# Patient Record
Sex: Male | Born: 1980 | Race: White | Hispanic: No | Marital: Single | Smoking: Never smoker
Health system: Southern US, Community
[De-identification: ages and names within clinical notes are randomized; demographics above are authoritative.]

## PROBLEM LIST (undated history)

## (undated) DIAGNOSIS — J45909 Unspecified asthma, uncomplicated: Secondary | ICD-10-CM

## (undated) DIAGNOSIS — M1990 Unspecified osteoarthritis, unspecified site: Secondary | ICD-10-CM

## (undated) DIAGNOSIS — Z1507 Genetic susceptibility to other malignant neoplasm: Secondary | ICD-10-CM

## (undated) DIAGNOSIS — I10 Essential (primary) hypertension: Secondary | ICD-10-CM

## (undated) DIAGNOSIS — Z1509 Genetic susceptibility to other malignant neoplasm: Secondary | ICD-10-CM

## (undated) DIAGNOSIS — Z800 Family history of malignant neoplasm of digestive organs: Secondary | ICD-10-CM

## (undated) DIAGNOSIS — T7840XA Allergy, unspecified, initial encounter: Secondary | ICD-10-CM

## (undated) DIAGNOSIS — Z801 Family history of malignant neoplasm of trachea, bronchus and lung: Secondary | ICD-10-CM

## (undated) DIAGNOSIS — F419 Anxiety disorder, unspecified: Secondary | ICD-10-CM

## (undated) DIAGNOSIS — K219 Gastro-esophageal reflux disease without esophagitis: Secondary | ICD-10-CM

## (undated) HISTORY — PX: HERNIA REPAIR: SHX51

## (undated) HISTORY — DX: Unspecified osteoarthritis, unspecified site: M19.90

## (undated) HISTORY — DX: Gastro-esophageal reflux disease without esophagitis: K21.9

## (undated) HISTORY — DX: Allergy, unspecified, initial encounter: T78.40XA

## (undated) HISTORY — DX: Anxiety disorder, unspecified: F41.9

## (undated) HISTORY — DX: Genetic susceptibility to other malignant neoplasm: Z15.09

## (undated) HISTORY — PX: POLYPECTOMY: SHX149

## (undated) HISTORY — DX: Family history of malignant neoplasm of trachea, bronchus and lung: Z80.1

## (undated) HISTORY — DX: Genetic susceptibility to other malignant neoplasm: Z15.07

## (undated) HISTORY — DX: Essential (primary) hypertension: I10

## (undated) HISTORY — DX: Unspecified asthma, uncomplicated: J45.909

## (undated) HISTORY — PX: COLONOSCOPY: SHX174

## (undated) HISTORY — DX: Family history of malignant neoplasm of digestive organs: Z80.0

---
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
## 2005-10-31 ENCOUNTER — Ambulatory Visit: Payer: Self-pay | Admitting: Family Medicine

## 2011-10-20 ENCOUNTER — Encounter (HOSPITAL_BASED_OUTPATIENT_CLINIC_OR_DEPARTMENT_OTHER): Payer: Self-pay | Admitting: Emergency Medicine

## 2011-10-20 ENCOUNTER — Emergency Department (HOSPITAL_BASED_OUTPATIENT_CLINIC_OR_DEPARTMENT_OTHER)
Admission: EM | Admit: 2011-10-20 | Discharge: 2011-10-20 | Disposition: A | Discharge status: Home / Self Care | Payer: Self-pay | Attending: Emergency Medicine | Admitting: Emergency Medicine

## 2011-10-20 DIAGNOSIS — L509 Urticaria, unspecified: Secondary | ICD-10-CM | POA: Insufficient documentation

## 2011-10-20 DIAGNOSIS — L259 Unspecified contact dermatitis, unspecified cause: Secondary | ICD-10-CM | POA: Insufficient documentation

## 2011-10-20 DIAGNOSIS — L309 Dermatitis, unspecified: Secondary | ICD-10-CM

## 2011-10-20 MED ORDER — PREDNISONE 10 MG PO TABS: ORAL_TABLET | ORAL | Status: DC

## 2011-10-20 MED ORDER — TRIAMCINOLONE ACETONIDE 0.5 % EX OINT: TOPICAL_OINTMENT | Freq: Two times a day (BID) | CUTANEOUS | Status: AC

## 2011-10-20 MED ORDER — EPINEPHRINE 0.3 MG/0.3ML IJ DEVI: 0.3000 mg | INTRAMUSCULAR | Status: DC | PRN

## 2011-10-20 MED ORDER — FAMOTIDINE 20 MG PO TABS
20.0000 mg | ORAL_TABLET | Freq: Once | ORAL | Status: AC
  Administered 2011-10-20: 20 mg via ORAL
  Filled 2011-10-20: qty 1

## 2011-10-20 MED ORDER — DIPHENHYDRAMINE HCL 50 MG PO CAPS
50.0000 mg | ORAL_CAPSULE | Freq: Once | ORAL | Status: AC
  Administered 2011-10-20: 50 mg via ORAL
  Filled 2011-10-20: qty 1

## 2011-10-20 MED ORDER — DIPHENHYDRAMINE HCL 25 MG PO CAPS
ORAL_CAPSULE | ORAL | Status: AC
  Filled 2011-10-20: qty 2

## 2011-10-20 MED ORDER — HYDROXYZINE HCL 25 MG PO TABS: 25.0000 mg | ORAL_TABLET | Freq: Four times a day (QID) | ORAL | Status: AC

## 2011-10-20 MED ORDER — PREDNISONE 50 MG PO TABS
60.0000 mg | ORAL_TABLET | Freq: Once | ORAL | Status: AC
  Administered 2011-10-20: 60 mg via ORAL
  Filled 2011-10-20: qty 1

## 2011-10-20 NOTE — ED Notes (Signed)
Pt has red raised rash on arms onset yesterday, worsening tonight.

## 2011-10-20 NOTE — ED Provider Notes (Signed)
History     CSN: [id number]  Arrival date & time 10/20/11  0236   First MD Initiated Contact with Patient 10/20/11 0242      Chief Complaint  Patient presents with  . Rash    (Consider location/radiation/quality/duration/timing/severity/associated sxs/prior treatment) HPI Patient is a 31 year old male who presents tonight complaining of rash over his bilateral forearms and now his legs. Patient noticed this for the first time yesterday. He says it is now extended up to his forearms. The reaction began in his wrists. 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 He wears gloves and long sleeves. The globes are provided by the company and he gets a new pair every week. They have been the same every time.  He reports that there cough and not latex. He cannot think of any substances that he has had to handle was contaminated but his gloves in his sleeves. One of his coworkers reports that he had a similar reaction when he first started working there. Patient also has lesions on the anterior surface of his shins as well as on the palms of his hands. There are also several urticarial-appearing lesions on his right shoulder and on his abdomen. He's had no abdominal pain, nausea, vomiting, and difficulty with swallowing, or difficulty with breathing. He has not taken anything for this. Patient does describe pain in his hands from the swelling and he does appear to have swelling bilaterally. He has no new drug or topical exposures. He has no known history of hives or allergic reactions. There are no other associated or modifying factors. History reviewed. No pertinent past medical history.  Past Surgical History  Procedure Date  . Hernia repair     No family history on file.  History  Substance Use Topics  . Smoking status: Never Smoker   . Smokeless tobacco: Not on file  . Alcohol Use: Yes      Review of Systems  Constitutional: Negative.   HENT:  Negative.   Eyes: Negative.   Respiratory: Negative.   Cardiovascular: Negative.   Gastrointestinal: Negative.   Genitourinary: Negative.   Musculoskeletal: Negative.   Skin: Positive for rash.  Neurological: Negative.   Hematological: Negative.   Psychiatric/Behavioral: Negative.   All other systems reviewed and are negative.    Allergies  Amoxil  Home Medications  No current outpatient prescriptions on file.  BP 127/82  Pulse 86  Temp(Src) 97.8 F (36.6 C) (Oral)  Resp 18  SpO2 99%  Physical Exam  Nursing note and vitals reviewed. Constitutional: He is oriented to person, place, and time. He appears well-developed and well-nourished. No distress.  HENT:  Head: Normocephalic and atraumatic.  Eyes: Conjunctivae and EOM are normal. Pupils are equal, round, and reactive to light.  Neck: Normal range of motion.  Cardiovascular: Normal rate, regular rhythm, normal heart sounds and intact distal pulses.  Exam reveals no gallop and no friction rub.   No murmur heard. Pulmonary/Chest: Effort normal and breath sounds normal. No respiratory distress. He has no wheezes.  Abdominal: Soft. Bowel sounds are normal. He exhibits no distension. There is no tenderness. There is no rebound and no guarding.  Musculoskeletal: Normal range of motion.  Neurological: He is alert and oriented to person, place, and time.  Skin: Skin is warm and dry. Rash noted.       Patient has urticarial rash that accompanies his entire hand and extends 3 inches proximally over the volar forearm.  There are numerous satellite lesions that also appear urticarial. They are raised erythematous plaques that are blanching. Patient also has urticaria noted on the anterior surface of the shins bilaterally. There are also small lesions noted over the right shoulder and a few noted on the abdomen as well  Psychiatric: He has a normal mood and affect.    ED Course  Procedures (including critical care time)  Labs  Reviewed - No data to display No results found.   1. Urticaria   2. Dermatitis due to unknown cause       MDM  Patient's presentation was consistent with urticaria and was secondary to an unknown substance based on history. Patient was treated with Benadryl, Pepcid, and prednisone. Patient had stable vital signs and no evidence of airway compromise or intestinal edema. Patient was given ice for swelling in his hands. He was specifically asked about numerous household and industrial exposures. No precipitating factor was identified. Patient is not on any medications and has consumed no new foods. His exposures likely occupational as he has recently begun working in a scrap yard over the past 3 weeks.  Patient did have some improvement. He remained hemodynamically stable. Patient was discharged with several prescriptions. I do think his symptoms are a combination of urticaria as well as possible contact dermatitis as well. He is given a prescription for triamcinolone cream to apply twice daily. He was also given a prescription for prednisone taper. EpiPen prescription was also given. Finally patient was told that he can take Benadryl over-the-counter every 6 hours as needed for his symptoms however if he continues to have itching with this he can instead use prescription for hydroxyzine there was also given today. It is made clear that he is not to take both of these. Patient was warned about the side effects of these medications including somnolence. A work note was provided.        Cyndra Numbers, MD 10/20/11 [phone]

## 2011-10-20 NOTE — ED Notes (Signed)
Pt awake and alert. Pt ready to go home.

## 2011-10-20 NOTE — ED Notes (Signed)
Redness and rash improving post medication. Pt too drowsy to drive at this time. Pt allowed to sleep until ready to leave.

## 2015-05-21 ENCOUNTER — Ambulatory Visit (INDEPENDENT_AMBULATORY_CARE_PROVIDER_SITE_OTHER): Payer: BLUE CROSS/BLUE SHIELD | Admitting: Osteopathic Medicine

## 2015-05-21 ENCOUNTER — Encounter: Payer: Self-pay | Admitting: Osteopathic Medicine

## 2015-05-21 VITALS — BP 133/84 | HR 96 | Ht 67.0 in | Wt 228.0 lb

## 2015-05-21 DIAGNOSIS — Z13220 Encounter for screening for lipoid disorders: Secondary | ICD-10-CM | POA: Diagnosis not present

## 2015-05-21 DIAGNOSIS — Z114 Encounter for screening for human immunodeficiency virus [HIV]: Secondary | ICD-10-CM | POA: Insufficient documentation

## 2015-05-21 DIAGNOSIS — Z0000 Encounter for general adult medical examination without abnormal findings: Secondary | ICD-10-CM | POA: Insufficient documentation

## 2015-05-21 DIAGNOSIS — Z1159 Encounter for screening for other viral diseases: Secondary | ICD-10-CM | POA: Insufficient documentation

## 2015-05-21 DIAGNOSIS — R5382 Chronic fatigue, unspecified: Secondary | ICD-10-CM | POA: Diagnosis not present

## 2015-05-21 DIAGNOSIS — F810 Specific reading disorder: Secondary | ICD-10-CM | POA: Insufficient documentation

## 2015-05-21 NOTE — Progress Notes (Signed)
Chief complaint: "Get a doctor"  HPI: Nicholas Steele is a 34 y.o. male who presents to [place]  today to establish primary care physician. Has not been to a doctor in about 15 years. Reports problems with attention deficit disorder. States his boss wants him to be on medication for attention deficit (which he refers to as "hypertension disorder"), was on Ritalin in the past when he was a kid, doesn't remember if it helped. Had to go to specialist in [place], [place] - doesn't remember specific diagnosis. He also states his bath has concerns about depression. Patient denies thoughts of suicide. 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 Past Surgical History  Procedure Laterality Date  . Hernia repair     Social History  Substance Use Topics  . Smoking status: Never Smoker   . Smokeless tobacco: Not on file  . Alcohol Use: Yes   Medications: No current outpatient prescriptions on file.   No current facility-administered medications for this visit.   Allergies  Allergen Reactions  . Amoxicillin      Review of Systems: CONSTITUTIONAL: Neg fever/chills, no  unintentional weight changes, positive fatigue HEAD/EYES/EARS/NOSE: No headache/vision change/hearing change CARDIAC: No chest pain/pressure/palpitations, no orthopnea RESPIRATORY: No cough/shortness of breath/wheeze GASTROINTESTINAL: No nausea/vomiting/abdominal pain/blood in stool/diarrhea/constipation MUSCULOSKELETAL: reports occasional myalgia/arthralgia GENITOURINARY: No incontinence, No abnormal genital bleeding/discharge. SKIN: No rash/wounds/concerning lesions  HEM/ONC: No easy bruising/bleeding, no abnormal lymph node  PSYCHIATRIC: Concerned about anxiety, sleeping problem, depression. Never been on medications.  Endocrine: No heat or cold intolerance, positive fatigue, no hair or skin changes    Exam:  BP 133/84 mmHg  Pulse 96  Ht 5\' 7"  (1.702 m)  Wt 228 lb (103.42 kg)  BMI 35.70 kg/m2  SpO2 96% Constitutional: VSS, see nurse notes. General Appearance: alert, well-developed, well-nourished, NAD Eyes: Normal lids and conjunctive, non-icteric sclera, PERRLA Ears, Nose, Mouth, Throat: Normal external auditory canal and TM bilaterally, MMM, posterior pharynx without erythema/exudate Neck: No masses, trachea midline. No thyroid enlargement/tenderness/mass appreciated Respiratory: Normal respiratory effort. No dullness/hyper-resonance to percussion. Breath sounds normal, no wheeze/rhonchi/rales Cardiovascular: S1/S2 normal, no murmur/rub/gallop auscultated. No carotid bruit or JVD. Pedal pulse II/IV bilaterally DP and PT. No lower extremity edema Gastrointestinal: Nontender, no masses. No hepatomegaly, no splenomegaly. No hernia appreciated.  Musculoskeletal: Gait normal. No clubbing/cyanosis of digits. Neurological: No cranial nerve deficit on limited exam. Motor and sensation intact and symmetric Psychiatric: Limited judgment/insight. Normal mood and affect   No results found for this or any previous visit (from the past 24 hour(s)). No results  found.   ASSESSMENT/PLAN: Please see individual assessment and plan sections for details and orders.  Summary and/or additional information as follows: Discussed with the patient the importance of routine  preventive care. Will obtain basic screening tests and lab work today and encouraged follow-up to address the multiple other problems which he has not had addressed since he last saw a doctor 15 years ago. Suspect learning disability more likely than attention deficit problem, likely component of depression as well. We'll explore this further on subsequent visits. Patient has great difficulty reading and spelling.   MALE PREVENTIVE CARE  ANNUAL SCREENING/COUNSELING Tobacco - NEVER Alcohol - DAILY  Diet/Exercise - HEALTHY Sexual Health/STI - NO CONCERNS FOR STI TESTING AT THIS TIME  Depression - PQH2 (+) Domestic violence - NO CONCERNS HTN - SEE VITALS Vaccination status - UTD PER PATIENT  INFECTIOUS DISEASE SCREENING HIV - all adults 15-65 - DUE HepC - born 72-1966 - DUE  DISEASE SCREENING Lipid - age 33+ if risk, all age 54+ - DUE  DM2 - overweight age 20-70 or other risk factors - DUE AAA - [place] once, age 57-75 if risk - NO NEED  CANCER SCREENING Lung - low dose CT Chest age 54-80 - NEVER SMOKER Colon - age 8+ or 35 years of age prior to [place] Dx - NORMAL COLONOSCOPY AT AGE 75, DUE FOR REPEAT (Q5YR WAS RECOMMENDED) Prostate - discuss PSA options - DECLINES  VACCINATION Influenza - annual - RECOMMENDED TD - every 10 years - PT UNSURE LAST VACCINE BUT WAS FEW YEARS AGO (WORKS WITH SCRAP MET[employer]) HPV - age <55yo - NO NEED NOW Zoster - age 88+ - NO NEED NOW Pneumonia - age 60+ sooner if risk (DM, other) - NO NEED NOW  OTHER Fall - exercise and Vit D age 60+ - NO NEED NOW Consider ASA - age 41-59 - NO NEED NOW

## 2015-05-21 NOTE — Addendum Note (Signed)
Addended by: Maryla Morrow on: 05/21/2015 03:28 PM   Modules accepted: Level of Service

## 2015-05-21 NOTE — Patient Instructions (Signed)
Preventive Care for Adults A healthy lifestyle and preventive care can promote health and wellness. Preventive health guidelines for men include the following key practices:  A routine yearly physical is a good way to check with your health care provider about your health and preventative screening. It is a chance to share any concerns and updates on your health and to receive a thorough exam.  Visit your dentist for a routine exam and preventative care every 6 months. Brush your teeth twice a day and floss once a day. Good oral hygiene prevents tooth decay and gum disease.  The frequency of eye exams is based on your age, health, family medical history, use of contact lenses, and other factors. Follow your health care provider's recommendations for frequency of eye exams.  Eat a healthy diet. Foods such as vegetables, fruits, whole grains, low-fat dairy products, and lean protein foods contain the nutrients you need without too many calories. Decrease your intake of foods high in solid fats, added sugars, and salt. Eat the right amount of calories for you.Get information about a proper diet from your health care provider, if necessary.  Regular physical exercise is one of the most important things you can do for your health. Most adults should get at least 150 minutes of moderate-intensity exercise (any activity that increases your heart rate and causes you to sweat) each week. In addition, most adults need muscle-strengthening exercises on 2 or more days a week.  Maintain a healthy weight. The body mass index (BMI) is a screening tool to identify possible weight problems. It provides an estimate of body fat based on height and weight. Your health care provider can find your BMI and can help you achieve or maintain a healthy weight.For adults 20 years and older:  A BMI below 18.5 is considered underweight.  A BMI of 18.5 to 24.9 is normal.  A BMI of 25 to 29.9 is considered overweight.  A BMI  of 30 and above is considered obese.  Maintain normal blood lipids and cholesterol levels by exercising and minimizing your intake of saturated fat. Eat a balanced diet with plenty of fruit and vegetables. Blood tests for lipids and cholesterol should begin at age 20 and be repeated every 5 years. If your lipid or cholesterol levels are high, you are over 50, or you are at high risk for heart disease, you may need your cholesterol levels checked more frequently.Ongoing high lipid and cholesterol levels should be treated with medicines if diet and exercise are not working.  If you smoke, find out from your health care provider how to quit. If you do not use tobacco, do not start.  Lung cancer screening is recommended for adults aged 55-80 years who are at high risk for developing lung cancer because of a history of smoking. A yearly low-dose CT scan of the lungs is recommended for people who have at least a 30-pack-year history of smoking and are a current smoker or have quit within the past 15 years. A pack year of smoking is smoking an average of 1 pack of cigarettes a day for 1 year (for example: 1 pack a day for 30 years or 2 packs a day for 15 years). Yearly screening should continue until the smoker has stopped smoking for at least 15 years. Yearly screening should be stopped for people who develop a health problem that would prevent them from having lung cancer treatment.  If you choose to drink alcohol, do not have more than   2 drinks per day. One drink is considered to be 12 ounces (355 mL) of beer, 5 ounces (148 mL) of wine, or 1.5 ounces (44 mL) of liquor.  Avoid use of street drugs. Do not share needles with anyone. Ask for help if you need support or instructions about stopping the use of drugs.  High blood pressure causes heart disease and increases the risk of stroke. Your blood pressure should be checked at least every 1-2 years. Ongoing high blood pressure should be treated with  medicines, if weight loss and exercise are not effective.  If you are 45-79 years old, ask your health care provider if you should take aspirin to prevent heart disease.  Diabetes screening involves taking a blood sample to check your fasting blood sugar level. This should be done once every 3 years, after age 45, if you are within normal weight and without risk factors for diabetes. Testing should be considered at a younger age or be carried out more frequently if you are overweight and have at least 1 risk factor for diabetes.  Colorectal cancer can be detected and often prevented. Most routine colorectal cancer screening begins at the age of 50 and continues through age 75. However, your health care provider may recommend screening at an earlier age if you have risk factors for colon cancer. On a yearly basis, your health care provider may provide home test kits to check for hidden blood in the stool. Use of a small camera at the end of a tube to directly examine the colon (sigmoidoscopy or colonoscopy) can detect the earliest forms of colorectal cancer. Talk to your health care provider about this at age 50, when routine screening begins. Direct exam of the colon should be repeated every 5-10 years through age 75, unless early forms of precancerous polyps or small growths are found.  People who are at an increased risk for hepatitis B should be screened for this virus. You are considered at high risk for hepatitis B if:  You were born in a country where hepatitis B occurs often. Talk with your health care provider about which countries are considered high risk.  Your parents were born in a high-risk country and you have not received a shot to protect against hepatitis B (hepatitis B vaccine).  You have HIV or AIDS.  You use needles to inject street drugs.  You live with, or have sex with, someone who has hepatitis B.  You are a man who has sex with other men (MSM).  You get hemodialysis  treatment.  You take certain medicines for conditions such as cancer, organ transplantation, and autoimmune conditions.  Hepatitis C blood testing is recommended for all people born from 1945 through 1965 and any individual with known risks for hepatitis C.  Practice safe sex. Use condoms and avoid high-risk sexual practices to reduce the spread of sexually transmitted infections (STIs). STIs include gonorrhea, chlamydia, syphilis, trichomonas, herpes, HPV, and human immunodeficiency virus (HIV). Herpes, HIV, and HPV are viral illnesses that have no cure. They can result in disability, cancer, and death.  If you are at risk of being infected with HIV, it is recommended that you take a prescription medicine daily to prevent HIV infection. This is called preexposure prophylaxis (PrEP). You are considered at risk if:  You are a man who has sex with other men (MSM) and have other risk factors.  You are a heterosexual man, are sexually active, and are at increased risk for HIV infection.    You take drugs by injection.  You are sexually active with a partner who has HIV.  Talk with your health care provider about whether you are at high risk of being infected with HIV. If you choose to begin PrEP, you should first be tested for HIV. You should then be tested every 3 months for as long as you are taking PrEP.  A one-time screening for abdominal aortic aneurysm (AAA) and surgical repair of large AAAs by ultrasound are recommended for men ages 65 to 75 years who are current or former smokers.  Healthy men should no longer receive prostate-specific antigen (PSA) blood tests as part of routine cancer screening. Talk with your health care provider about prostate cancer screening.  Testicular cancer screening is not recommended for adult males who have no symptoms. Screening includes self-exam, a health care provider exam, and other screening tests. Consult with your health care provider about any symptoms  you have or any concerns you have about testicular cancer.  Use sunscreen. Apply sunscreen liberally and repeatedly throughout the day. You should seek shade when your shadow is shorter than you. Protect yourself by wearing long sleeves, pants, a wide-brimmed hat, and sunglasses year round, whenever you are outdoors.  Once a month, do a whole-body skin exam, using a mirror to look at the skin on your back. Tell your health care provider about new moles, moles that have irregular borders, moles that are larger than a pencil eraser, or moles that have changed in shape or color.  Stay current with required vaccines (immunizations).  Influenza vaccine. All adults should be immunized every year.  Tetanus, diphtheria, and acellular pertussis (Td, Tdap) vaccine. An adult who has not previously received Tdap or who does not know his vaccine status should receive 1 dose of Tdap. This initial dose should be followed by tetanus and diphtheria toxoids (Td) booster doses every 10 years. Adults with an unknown or incomplete history of completing a 3-dose immunization series with Td-containing vaccines should begin or complete a primary immunization series including a Tdap dose. Adults should receive a Td booster every 10 years.  Varicella vaccine. An adult without evidence of immunity to varicella should receive 2 doses or a second dose if he has previously received 1 dose.  Human papillomavirus (HPV) vaccine. Males aged 13-21 years who have not received the vaccine previously should receive the 3-dose series. Males aged 22-26 years may be immunized. Immunization is recommended through the age of 26 years for any male who has sex with males and did not get any or all doses earlier. Immunization is recommended for any person with an immunocompromised condition through the age of 26 years if he did not get any or all doses earlier. During the 3-dose series, the second dose should be obtained 4-8 weeks after the first  dose. The third dose should be obtained 24 weeks after the first dose and 16 weeks after the second dose.  Zoster vaccine. One dose is recommended for adults aged 60 years or older unless certain conditions are present.  Measles, mumps, and rubella (MMR) vaccine. Adults born before 1957 generally are considered immune to measles and mumps. Adults born in 1957 or later should have 1 or more doses of MMR vaccine unless there is a contraindication to the vaccine or there is laboratory evidence of immunity to each of the three diseases. A routine second dose of MMR vaccine should be obtained at least 28 days after the first dose for students attending postsecondary   schools, health care workers, or international travelers. People who received inactivated measles vaccine or an unknown type of measles vaccine during 1963-1967 should receive 2 doses of MMR vaccine. People who received inactivated mumps vaccine or an unknown type of mumps vaccine before 1979 and are at high risk for mumps infection should consider immunization with 2 doses of MMR vaccine. Unvaccinated health care workers born before 1957 who lack laboratory evidence of measles, mumps, or rubella immunity or laboratory confirmation of disease should consider measles and mumps immunization with 2 doses of MMR vaccine or rubella immunization with 1 dose of MMR vaccine.  Pneumococcal 13-valent conjugate (PCV13) vaccine. When indicated, a person who is uncertain of his immunization history and has no record of immunization should receive the PCV13 vaccine. An adult aged 19 years or older who has certain medical conditions and has not been previously immunized should receive 1 dose of PCV13 vaccine. This PCV13 should be followed with a dose of pneumococcal polysaccharide (PPSV23) vaccine. The PPSV23 vaccine dose should be obtained at least 8 weeks after the dose of PCV13 vaccine. An adult aged 19 years or older who has certain medical conditions and  previously received 1 or more doses of PPSV23 vaccine should receive 1 dose of PCV13. The PCV13 vaccine dose should be obtained 1 or more years after the last PPSV23 vaccine dose.  Pneumococcal polysaccharide (PPSV23) vaccine. When PCV13 is also indicated, PCV13 should be obtained first. All adults aged 65 years and older should be immunized. An adult younger than age 65 years who has certain medical conditions should be immunized. Any person who resides in a nursing home or long-term care facility should be immunized. An adult smoker should be immunized. People with an immunocompromised condition and certain other conditions should receive both PCV13 and PPSV23 vaccines. People with human immunodeficiency virus (HIV) infection should be immunized as soon as possible after diagnosis. Immunization during chemotherapy or radiation therapy should be avoided. Routine use of PPSV23 vaccine is not recommended for American Indians, [place] Natives, or people younger than 65 years unless there are medical conditions that require PPSV23 vaccine. When indicated, people who have unknown immunization and have no record of immunization should receive PPSV23 vaccine. One-time revaccination 5 years after the first dose of PPSV23 is recommended for people aged 19-64 years who have chronic kidney failure, nephrotic syndrome, asplenia, or immunocompromised conditions. People who received 1-2 doses of PPSV23 before age 65 years should receive another dose of PPSV23 vaccine at age 65 years or later if at least 5 years have passed since the previous dose. Doses of PPSV23 are not needed for people immunized with PPSV23 at or after age 65 years.  Meningococcal vaccine. Adults with asplenia or persistent complement component deficiencies should receive 2 doses of quadrivalent meningococcal conjugate (MenACWY-D) vaccine. The doses should be obtained at least 2 months apart. Microbiologists working with certain meningococcal bacteria,  military recruits, people at risk during an outbreak, and people who travel to or live in countries with a high rate of meningitis should be immunized. A first-year college student up through age 21 years who is living in a residence hall should receive a dose if he did not receive a dose on or after his 16th birthday. Adults who have certain high-risk conditions should receive one or more doses of vaccine.  Hepatitis A vaccine. Adults who wish to be protected from this disease, have certain high-risk conditions, work with hepatitis A-infected animals, work in hepatitis A research labs, or   travel to or work in countries with a high rate of hepatitis A should be immunized. Adults who were previously unvaccinated and who anticipate close contact with an international adoptee during the first 60 days after arrival in the [place] from a country with a high rate of hepatitis A should be immunized.  Hepatitis B vaccine. Adults should be immunized if they wish to be protected from this disease, have certain high-risk conditions, may be exposed to blood or other infectious body fluids, are household contacts or sex partners of hepatitis B positive people, are clients or workers in certain care facilities, or travel to or work in countries with a high rate of hepatitis B.  Haemophilus influenzae type b (Hib) vaccine. A previously unvaccinated person with asplenia or sickle cell disease or having a scheduled splenectomy should receive 1 dose of Hib vaccine. Regardless of previous immunization, a recipient of a hematopoietic stem cell transplant should receive a 3-dose series 6-12 months after his successful transplant. Hib vaccine is not recommended for adults with HIV infection. Preventive Service / Frequency Ages 19 to 39  Blood pressure check.** / Every 1 to 2 years.  Lipid and cholesterol check.** / Every 5 years beginning at age 20.  Hepatitis C blood test.** / For any individual with known risks for  hepatitis C.  Skin self-exam. / Monthly.  Influenza vaccine. / Every year.  Tetanus, diphtheria, and acellular pertussis (Tdap, Td) vaccine.** / Consult your health care provider. 1 dose of Td every 10 years.  Varicella vaccine.** / Consult your health care provider.  HPV vaccine. / 3 doses over 6 months, if 26 or younger.  Measles, mumps, rubella (MMR) vaccine.** / You need at least 1 dose of MMR if you were born in 1957 or later. You may also need a second dose.  Pneumococcal 13-valent conjugate (PCV13) vaccine.** / Consult your health care provider.  Pneumococcal polysaccharide (PPSV23) vaccine.** / 1 to 2 doses if you smoke cigarettes or if you have certain conditions.  Meningococcal vaccine.** / 1 dose if you are age 19 to 21 years and a first-year college student living in a residence hall, or have one of several medical conditions. You may also need additional booster doses.  Hepatitis A vaccine.** / Consult your health care provider.  Hepatitis B vaccine.** / Consult your health care provider.  Haemophilus influenzae type b (Hib) vaccine.** / Consult your health care provider. Ages 40 to 64  Blood pressure check.** / Every 1 to 2 years.  Lipid and cholesterol check.** / Every 5 years beginning at age 20.  Lung cancer screening. / Every year if you are aged 55-80 years and have a 30-pack-year history of smoking and currently smoke or have quit within the past 15 years. Yearly screening is stopped once you have quit smoking for at least 15 years or develop a health problem that would prevent you from having lung cancer treatment.  Fecal occult blood test (FOBT) of stool. / Every year beginning at age 50 and continuing until age 75. You may not have to do this test if you get a colonoscopy every 10 years.  Flexible sigmoidoscopy** or colonoscopy.** / Every 5 years for a flexible sigmoidoscopy or every 10 years for a colonoscopy beginning at age 50 and continuing until age  75.  Hepatitis C blood test.** / For all people born from 1945 through 1965 and any individual with known risks for hepatitis C.  Skin self-exam. / Monthly.  Influenza vaccine. / Every   year.  Tetanus, diphtheria, and acellular pertussis (Tdap/Td) vaccine.** / Consult your health care provider. 1 dose of Td every 10 years.  Varicella vaccine.** / Consult your health care provider.  Zoster vaccine.** / 1 dose for adults aged 60 years or older.  Measles, mumps, rubella (MMR) vaccine.** / You need at least 1 dose of MMR if you were born in 1957 or later. You may also need a second dose.  Pneumococcal 13-valent conjugate (PCV13) vaccine.** / Consult your health care provider.  Pneumococcal polysaccharide (PPSV23) vaccine.** / 1 to 2 doses if you smoke cigarettes or if you have certain conditions.  Meningococcal vaccine.** / Consult your health care provider.  Hepatitis A vaccine.** / Consult your health care provider.  Hepatitis B vaccine.** / Consult your health care provider.  Haemophilus influenzae type b (Hib) vaccine.** / Consult your health care provider. Ages 65 and over  Blood pressure check.** / Every 1 to 2 years.  Lipid and cholesterol check.**/ Every 5 years beginning at age 20.  Lung cancer screening. / Every year if you are aged 55-80 years and have a 30-pack-year history of smoking and currently smoke or have quit within the past 15 years. Yearly screening is stopped once you have quit smoking for at least 15 years or develop a health problem that would prevent you from having lung cancer treatment.  Fecal occult blood test (FOBT) of stool. / Every year beginning at age 50 and continuing until age 75. You may not have to do this test if you get a colonoscopy every 10 years.  Flexible sigmoidoscopy** or colonoscopy.** / Every 5 years for a flexible sigmoidoscopy or every 10 years for a colonoscopy beginning at age 50 and continuing until age 75.  Hepatitis C blood  test.** / For all people born from 1945 through 1965 and any individual with known risks for hepatitis C.  Abdominal aortic aneurysm (AAA) screening.** / A one-time screening for ages 65 to 75 years who are current or former smokers.  Skin self-exam. / Monthly.  Influenza vaccine. / Every year.  Tetanus, diphtheria, and acellular pertussis (Tdap/Td) vaccine.** / 1 dose of Td every 10 years.  Varicella vaccine.** / Consult your health care provider.  Zoster vaccine.** / 1 dose for adults aged 60 years or older.  Pneumococcal 13-valent conjugate (PCV13) vaccine.** / Consult your health care provider.  Pneumococcal polysaccharide (PPSV23) vaccine.** / 1 dose for all adults aged 65 years and older.  Meningococcal vaccine.** / Consult your health care provider.  Hepatitis A vaccine.** / Consult your health care provider.  Hepatitis B vaccine.** / Consult your health care provider.  Haemophilus influenzae type b (Hib) vaccine.** / Consult your health care provider. **Family history and personal history of risk and conditions may change your health care provider's recommendations. Document Released: 11/21/2001 Document Revised: 09/30/2013 Document Reviewed: 02/20/2011 [employer] Patient Information 2015 [employer]. This information is not intended to replace advice given to you by your health care provider. Make sure you discuss any questions you have with your health care provider.  

## 2015-05-25 LAB — COMPLETE METABOLIC PANEL WITH GFR
ALT: 22 U/L (ref 9–46)
AST: 17 U/L (ref 10–40)
Albumin: 4.3 g/dL (ref 3.6–5.1)
Alkaline Phosphatase: 55 U/L (ref 40–115)
BUN: 14 mg/dL (ref 7–25)
CHLORIDE: 106 mmol/L (ref 98–110)
CO2: 25 mmol/L (ref 20–31)
CREATININE: 1.02 mg/dL (ref 0.60–1.35)
Calcium: 9.6 mg/dL (ref 8.6–10.3)
GFR, Est African American: >89 mL/min (ref >=60)
GFR, Est Non African American: >89 mL/min (ref >=60)
GLUCOSE: 85 mg/dL (ref 65–99)
Potassium: 4 mmol/L (ref 3.5–5.3)
SODIUM: 141 mmol/L (ref 135–146)
Total Bilirubin: 0.6 mg/dL (ref 0.2–1.2)
Total Protein: 7.2 g/dL (ref 6.1–8.1)

## 2015-05-25 LAB — T4, FREE: Free T4: 0.98 ng/dL (ref 0.80–1.80)

## 2015-05-25 LAB — LIPID PANEL
CHOLESTEROL: 195 mg/dL (ref 125–200)
HDL: 29 mg/dL — ABNORMAL LOW (ref >=40)
LDL CALC: 119 mg/dL (ref <130)
Total CHOL/HDL Ratio: 6.7 Ratio — ABNORMAL HIGH (ref <=5.0)
Triglycerides: 237 mg/dL — ABNORMAL HIGH (ref <150)
VLDL: 47 mg/dL — AB (ref <30)

## 2015-05-25 LAB — HIV ANTIBODY (ROUTINE TESTING W REFLEX): HIV 1&2 Ab, 4th Generation: NONREACTIVE

## 2015-05-25 LAB — HEPATITIS C ANTIBODY: HCV Ab: NEGATIVE

## 2015-05-25 LAB — TSH: TSH: 3.131 u[IU]/mL (ref 0.350–4.500)

## 2015-05-27 LAB — HEPATITIS C-INFECTED PATIENT, BASELINE PANEL
ALK PHOS: 55 U/L (ref 40–115)
ALT: 22 U/L (ref 9–46)
AST: 17 U/L (ref 10–40)
Albumin: 4.3 g/dL (ref 3.6–5.1)
BILIRUBIN INDIRECT: 0.5 mg/dL (ref 0.2–1.2)
Basophils Absolute: 0 10*3/uL (ref 0.0–0.1)
Basophils Relative: 0 % (ref 0–1)
Bilirubin, Direct: 0.1 mg/dL (ref <=0.2)
Creat: 1.02 mg/dL (ref 0.60–1.35)
EOS ABS: 0.1 10*3/uL (ref 0.0–0.7)
EOS PCT: 1 % (ref 0–5)
HEMATOCRIT: 45.1 % (ref 39.0–52.0)
HEP A TOTAL AB: NONREACTIVE
HEP B C TOTAL AB: NONREACTIVE
HEP B S AG: NEGATIVE
Hemoglobin: 14.9 g/dL (ref 13.0–17.0)
LYMPHS ABS: 2.3 10*3/uL (ref 0.7–4.0)
LYMPHS PCT: 27 % (ref 12–46)
MCH: 27.4 pg (ref 26.0–34.0)
MCHC: 33 g/dL (ref 30.0–36.0)
MCV: 83.1 fL (ref 78.0–100.0)
MONO ABS: 0.6 10*3/uL (ref 0.1–1.0)
MONOS PCT: 7 % (ref 3–12)
MPV: 9.3 fL (ref 8.6–12.4)
NEUTROS ABS: 5.6 10*3/uL (ref 1.7–7.7)
Neutrophils Relative %: 65 % (ref 43–77)
Platelets: 324 10*3/uL (ref 150–400)
RBC: 5.43 MIL/uL (ref 4.22–5.81)
RDW: 13.6 % (ref 11.5–15.5)
TOTAL PROTEIN: 7.2 g/dL (ref 6.1–8.1)
Total Bilirubin: 0.6 mg/dL (ref 0.2–1.2)
WBC: 8.6 10*3/uL (ref 4.0–10.5)

## 2015-06-18 ENCOUNTER — Encounter: Payer: Self-pay | Admitting: Osteopathic Medicine

## 2015-06-18 ENCOUNTER — Ambulatory Visit (INDEPENDENT_AMBULATORY_CARE_PROVIDER_SITE_OTHER): Payer: BLUE CROSS/BLUE SHIELD | Admitting: Osteopathic Medicine

## 2015-06-18 VITALS — BP 124/86 | HR 71 | Wt 233.0 lb

## 2015-06-18 DIAGNOSIS — M546 Pain in thoracic spine: Secondary | ICD-10-CM | POA: Diagnosis not present

## 2015-06-18 DIAGNOSIS — G5621 Lesion of ulnar nerve, right upper limb: Secondary | ICD-10-CM | POA: Diagnosis not present

## 2015-06-18 DIAGNOSIS — Z87828 Personal history of other (healed) physical injury and trauma: Secondary | ICD-10-CM | POA: Diagnosis not present

## 2015-06-18 DIAGNOSIS — M62830 Muscle spasm of back: Secondary | ICD-10-CM | POA: Diagnosis not present

## 2015-06-18 MED ORDER — MELOXICAM 7.5 MG PO TABS: 7.5000 mg | ORAL_TABLET | Freq: Two times a day (BID) | ORAL | Status: DC | PRN

## 2015-06-18 NOTE — Patient Instructions (Signed)
Back Pain, Adult °Back pain is very common. The pain often gets better over time. The cause of back pain is usually not dangerous. Most people can learn to manage their back pain on their own.  °HOME CARE  °· Stay active. Start with short walks on flat ground if you can. Try to walk farther each day. °· Do not sit, drive, or stand in one place for more than 30 minutes. Do not stay in bed. °· Do not avoid exercise or work. Activity can help your back heal faster. °· Be careful when you bend or lift an object. Bend at your knees, keep the object close to you, and do not twist. °· Sleep on a firm mattress. Lie on your side, and bend your knees. If you lie on your back, put a pillow under your knees. °· Only take medicines as told by your doctor. °· Put ice on the injured area. °¨ Put ice in a plastic bag. °¨ Place a towel between your skin and the bag. °¨ Leave the ice on for 15-20 minutes, 03-04 times a day for the first 2 to 3 days. After that, you can switch between ice and heat packs. °· Ask your doctor about back exercises or massage. °· Avoid feeling anxious or stressed. Find good ways to deal with stress, such as exercise. °GET HELP RIGHT AWAY IF:  °· Your pain does not go away with rest or medicine. °· Your pain does not go away in 1 week. °· You have new problems. °· You do not feel well. °· The pain spreads into your legs. °· You cannot control when you poop (bowel movement) or pee (urinate). °· Your arms or legs feel weak or lose feeling (numbness). °· You feel sick to your stomach (nauseous) or throw up (vomit). °· You have belly (abdominal) pain. °· You feel like you may pass out (faint). °MAKE SURE YOU:  °· Understand these instructions. °· Will watch your condition. °· Will get help right away if you are not doing well or get worse. °Document Released: 03/13/2008 Document Revised: 12/18/2011 Document Reviewed: 01/27/2014 °[employer]® Patient Information ©2015 [employer]. This information is not intended  to replace advice given to you by your health care provider. Make sure you discuss any questions you have with your health care provider. ° °

## 2015-06-18 NOTE — Progress Notes (Signed)
HPI: Nicholas Steele is a 34 y.o. male who presents to [facility]  today for chief complaint of:  Chief Complaint  Patient presents with  . Follow-up   Back pain . Location: upper back between R shoulder and center of back, also hurts in middle of back. Masseuse said muscles were swollen  . Quality: tederness/sore  . Severity: mild to moderate . Context: Previous injury - was hit by 39ft long 1.5 inches metal bar happened at work - worsers comp closed the case.  . Modifying factors: movement makes it worse. Swinging makes it worse . 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   Past medical, social and family history reviewed: No past medical history on file. Past Surgical History  Procedure Laterality Date  . Hernia repair     Social History  Substance Use Topics  . Smoking status: Never Smoker   . Smokeless tobacco: Not on file  . Alcohol Use: Yes   Family History  Problem Relation Age of Onset  . Hyperlipidemia Father   . Hypertension Father   . Stroke Father   . Depression Maternal Aunt   . Alcohol abuse Paternal Uncle   . Colon cancer Paternal Uncle   . Diabetes Paternal Uncle   . Diabetes Maternal Grandfather   . Colon cancer Paternal Grandfather     No current outpatient prescriptions on file.   No current facility-administered medications for this visit.   Allergies  Allergen Reactions  . Amoxicillin       Review of Systems: CONSTITUTIONAL: Neg fever/chills, no unintentional weight changes CARDIAC: No chest pain/pressure RESPIRATORY: No cough/shortness of breath/wheeze GASTROINTESTINAL: No nausea/vomiting/abdominal pain MUSCULOSKELETAL: Back pain as noted in history of present illness, occasional numbness in fourth and fifth digits of right hand as noted in history of present illness   Exam:  BP 124/86 mmHg  Pulse 71  Wt 233 lb (105.688 kg)  SpO2  95% Constitutional: VSS, see above. General Appearance: alert, well-developed, well-nourished, NAD Respiratory: Normal respiratory effort. No dullness/hyper-resonance to percussion. Breath sounds normal, no wheeze/rhonchi/rales Cardiovascular: S1/S2 normal, no murmur/rub/gallop auscultated. RRR.  Musculoskeletal: Gait normal. No clubbing/cyanosis of digits. Positive paraspinal tenderness lateral to spine, medial to the scapular border on the right side. Shoulder exam negative. Tinel's and Phalen's sign negative on right wrist. Normal range of motion. Patient is hunching. Spurling's test negative bilaterally. Neurological: No cranial nerve deficit on limited exam. Motor and sensation intact and symmetric    No results found for this or any previous visit (from the past 72 hour(s)).    ASSESSMENT/PLAN:  Right-sided thoracic back pain - Try NSAIDs for pain and refer to physical therapy, x-rays unlikely to be helpful - Plan: meloxicam (MOBIC) 7.5 MG tablet, Ambulatory referral to Physical Therapy  Spasm of back muscles - Plan: Ambulatory referral to Physical Therapy  History of back injury - Plan: Ambulatory referral to Physical Therapy  Cubital tunnel syndrome on right - Supportive care reviewed, advised wrist splint or Ace bandage wrapping and home stretches, will trial NSAIDs, RTC if no improvement   We'll treat conservatively, patient educated on importance of strengthening exercises as instructed by physical therapy. Doubt imaging will be of any use in this case but will consider if pain persists or worsens.

## 2015-07-02 ENCOUNTER — Ambulatory Visit (INDEPENDENT_AMBULATORY_CARE_PROVIDER_SITE_OTHER): Payer: BLUE CROSS/BLUE SHIELD | Admitting: Physical Therapy

## 2015-07-02 DIAGNOSIS — R531 Weakness: Secondary | ICD-10-CM | POA: Diagnosis not present

## 2015-07-02 DIAGNOSIS — M5440 Lumbago with sciatica, unspecified side: Secondary | ICD-10-CM

## 2015-07-02 DIAGNOSIS — R293 Abnormal posture: Secondary | ICD-10-CM

## 2015-07-02 DIAGNOSIS — M2560 Stiffness of unspecified joint, not elsewhere classified: Secondary | ICD-10-CM | POA: Diagnosis not present

## 2015-07-02 DIAGNOSIS — M546 Pain in thoracic spine: Secondary | ICD-10-CM

## 2015-07-02 NOTE — Patient Instructions (Signed)
  Scapular Retraction (Standing)   With arms at sides, pinch shoulder blades together. Repeat 10 times per set. Do 1-3 sets per session. Do 2 sessions per day.   Flexion   Sitting on knees, fold body over legs and relax head and arms on floor. Extend arms as far forward as possible. Hold 20-30 seconds. Repeat reaching to the right with both hands and then to the left. Do 2 sessions per day.  Copyright  VHI. All rights reserved.   Madelyn Flavors, PT 07/02/2015 8:42 AM  [facility] [place] [address] [place], [place] [address]  [phone] (office) [phone] (fax)
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
## 2015-07-09 ENCOUNTER — Encounter: Payer: BLUE CROSS/BLUE SHIELD | Admitting: Physical Therapy

## 2016-02-10 DIAGNOSIS — Z880 Allergy status to penicillin: Secondary | ICD-10-CM | POA: Diagnosis not present

## 2016-02-10 DIAGNOSIS — M1990 Unspecified osteoarthritis, unspecified site: Secondary | ICD-10-CM | POA: Diagnosis not present

## 2016-02-10 DIAGNOSIS — R1013 Epigastric pain: Secondary | ICD-10-CM | POA: Diagnosis not present

## 2016-02-10 DIAGNOSIS — K859 Acute pancreatitis without necrosis or infection, unspecified: Secondary | ICD-10-CM | POA: Diagnosis not present

## 2016-02-10 DIAGNOSIS — R109 Unspecified abdominal pain: Secondary | ICD-10-CM | POA: Diagnosis not present

## 2016-02-10 DIAGNOSIS — R112 Nausea with vomiting, unspecified: Secondary | ICD-10-CM | POA: Diagnosis not present

## 2016-02-23 ENCOUNTER — Encounter: Payer: Self-pay | Admitting: Osteopathic Medicine

## 2016-02-23 ENCOUNTER — Ambulatory Visit (INDEPENDENT_AMBULATORY_CARE_PROVIDER_SITE_OTHER): Payer: BLUE CROSS/BLUE SHIELD | Admitting: Osteopathic Medicine

## 2016-02-23 VITALS — BP 134/83 | HR 83 | Ht 67.0 in | Wt 232.0 lb

## 2016-02-23 DIAGNOSIS — R1013 Epigastric pain: Secondary | ICD-10-CM

## 2016-02-23 DIAGNOSIS — K8020 Calculus of gallbladder without cholecystitis without obstruction: Secondary | ICD-10-CM

## 2016-02-23 DIAGNOSIS — K449 Diaphragmatic hernia without obstruction or gangrene: Secondary | ICD-10-CM | POA: Insufficient documentation

## 2016-02-23 DIAGNOSIS — K760 Fatty (change of) liver, not elsewhere classified: Secondary | ICD-10-CM

## 2016-02-23 DIAGNOSIS — K5730 Diverticulosis of large intestine without perforation or abscess without bleeding: Secondary | ICD-10-CM

## 2016-02-23 DIAGNOSIS — K5790 Diverticulosis of intestine, part unspecified, without perforation or abscess without bleeding: Secondary | ICD-10-CM | POA: Insufficient documentation

## 2016-02-23 DIAGNOSIS — M47819 Spondylosis without myelopathy or radiculopathy, site unspecified: Secondary | ICD-10-CM | POA: Insufficient documentation

## 2016-02-23 NOTE — Patient Instructions (Signed)

## 2016-02-23 NOTE — Progress Notes (Signed)
HPI: Nicholas Steele is a 35 y.o. male who presents to [facility] today for chief complaint of:  Chief Complaint  Patient presents with  . Hospitalization Follow-up    ABDOMINAL PAIN    ER FOLLOWUP . Location: epigastric . Quality: cramping pain . Severity: was really bad, now resolved . Timing: 2 weeks ago . Context: made himself throw up to see if the pain would go away, it didn't help much, so he was going to get some Pepto and then went straight to emergency room.    Past medical, social and family history reviewed: No past medical history on file. Past Surgical History  Procedure Laterality Date  . Hernia repair     Social History  Substance Use Topics  . Smoking status: Never Smoker   . Smokeless tobacco: Not on file  . Alcohol Use: Yes   Family History  Problem Relation Age of Onset  . Hyperlipidemia Father   . Hypertension Father   . Stroke Father   . Depression Maternal Aunt   . Alcohol abuse Paternal Uncle   . Colon cancer Paternal Uncle   . Diabetes Paternal Uncle   . Diabetes Maternal Grandfather   . Colon cancer Paternal Grandfather     Current Outpatient Prescriptions  Medication Sig Dispense Refill  . meloxicam (MOBIC) 7.5 MG tablet Take 1 tablet (7.5 mg total) by mouth 2 (two) times daily as needed for pain. 30 tablet 2   No current facility-administered medications for this visit.   Allergies  Allergen Reactions  . Amoxicillin       Review of Systems: CONSTITUTIONAL:  No  fever, no chills, No  unintentional weight changes  CARDIAC: No  chest pain RESPIRATORY: No  cough GASTROINTESTINAL: No  nausea, No  vomiting, No  abdominal pain, No  blood in stool, No  diarrhea, No  constipation  MUSCULOSKELETAL: No  myalgia/arthralgia GENITOURINARY: No  incontinence, No  abnormal genital bleeding/discharge SKIN: No  rash/wounds/concerning lesions  Exam:  BP 134/83 mmHg  Pulse 83  Ht 5\' 7"  (1.702 m)  Wt 232 lb  (105.235 kg)  BMI 36.33 kg/m2 Constitutional: VS see above. General Appearance: alert, well-developed, well-nourished, NAD Eyes: Normal lids and conjunctive, non-icteric sclera,  Ears, Nose, Mouth, Throat: MMM, Normal external inspection ears/nares/mouth/lips/gums,   Neck: No masses, trachea midline.  Respiratory: Normal respiratory effort. no wheeze, no rhonchi, no rales Cardiovascular: S1/S2 normal, no murmur, no rub/gallop auscultated. RRR.No lower extremity edema. Gastrointestinal: Nontender, no masses. No hepatomegaly, no splenomegaly. No hernia appreciated. Bowel sounds normal. Rectal exam deferred.  Musculoskeletal: Gait normal. No clubbing/cyanosis of digits.  Skin: warm, dry, intact. No rash/ulcer. No concerning nevi or subq nodules on limited exam.       HOSPITAL RECORDS REVIEWED: ER, [facility], 02/10/16   Lipase 104 (*)   Imaging:  XR ABDOMEN ACUTE FLAT, UPRIGHT, CHEST 1V  Narrative:  COMPARISON: January 23, 2012. TECHNIQUE: AP view of the chest. Supine and erect AP views of the abdomen. FINDINGS: ... The bowel gas pattern is nonspecific. No bowel obstruction is identified. No obvious intraperitoneal free air is seen. No pathologic calcifications are identified. A moderate amount of gas and stool are identified throughout the colon and rectum. Impression:  IMPRESSION:  1. No acute cardiopulmonary disease identified. 2. Nonspecific bowel gas pattern.    CT ABDOMEN PELVIS W CONTRAST  Narrative:  COMPARISON: 02/10/2016 INDICATION: epigastric abd pain, mildly elevated lipase... FINDINGS: ABDOMEN AND PELVIS: Lower chest: Mild bilateral basilar and dependent  subsegmental atelectasis is present. Liver: There is mild diffuse fatty infiltration of the liver. Gallbladder and biliary tree: Mild distention of the gallbladder, with multiple subcentimeter gallstones Pancreas: Unremarkable Spleen: There is mild enlargement of the spleen. Adrenals: Unremarkable Kidneys and urinary  bladder: Unremarkable Reproductive: Unremarkable Abdominal aorta: Unremarkable Lymph nodes: Unremarkable Peritoneum: No free fluid. No free intraperitoneal air. Gastrointestinal tract: Small hiatal hernia. Negative for bowel obstruction. Negative for acute appendicitis. Colonic diverticulosis without acute diverticulitis. Abdominal and pelvic walls: Unremarkable Bones: There are multilevel degenerative changes of the spine. Impression:  IMPRESSION: 1. No acute process is identified. 2. No peripancreatic induration or fluid is identified. The pancreas is unremarkable in appearance.     ASSESSMENT/PLAN: Patient symptoms have resolved at this time, I went over the CT results with him above, nothing concerning. Advised may need to be on chronic antacid therapy for hiatal hernia, but he really doesn't complain of heartburn other than every now and then. Advised when necessary Tums or Pepto, RTC if worse. Advised that he does have hiatal hernia, small gallstones, arthritis of the spine, mild fatty liver. Advised can continue current medications, advised against hydrocodone pain medicine which was prescribed by the emergency room. Declined repeat labs.  Abdominal pain, epigastric - Lipase was mildly elevated, patient discharged from ER to follow-up here, possible hiatal hernia/gastritis pain, see note for full details regarding CT results - Plan: sucralfate (CARAFATE) 1 g tablet, ondansetron (ZOFRAN-ODT) 4 MG disintegrating tablet  Fatty liver  Hiatal hernia  Gallstones  Arthritis of spine  Diverticulosis of large intestine without hemorrhage    All questions were answered. Visit summary with updated medication list and pertinent instructions was printed for patient. ER/RTC precautions were reviewed with the patient. Return if symptoms worsen or fail to improve.

## 2016-08-03 ENCOUNTER — Encounter: Payer: Self-pay | Admitting: Osteopathic Medicine

## 2016-08-03 ENCOUNTER — Ambulatory Visit (INDEPENDENT_AMBULATORY_CARE_PROVIDER_SITE_OTHER): Payer: BLUE CROSS/BLUE SHIELD | Admitting: Osteopathic Medicine

## 2016-08-03 VITALS — BP 121/76 | HR 79 | Ht 67.0 in | Wt 226.0 lb

## 2016-08-03 DIAGNOSIS — Z0000 Encounter for general adult medical examination without abnormal findings: Secondary | ICD-10-CM | POA: Diagnosis not present

## 2016-08-03 NOTE — Progress Notes (Signed)
HPI: Nicholas Steele is a 35 y.o. male  who presents to [facility] today, 08/03/16,  for chief complaint of:  Chief Complaint  Patient presents with  . Annual Exam     Patient here for annual physical exam. No complaints today, see below for preventive care review.  Concerns about colon cancer: Pat. Grandfather was diagnosed which prompted his children to get screened. Pat. Uncle was diagnosed in [place]. Patient's father screened negative. No known Lynch syndrome.     Past medical, surgical, social and family history reviewed: History reviewed. No pertinent past medical history. Past Surgical History:  Procedure Laterality Date  . HERNIA REPAIR     Social History  Substance Use Topics  . Smoking status: Never Smoker  . Smokeless tobacco: Not on file  . Alcohol use Yes   Family History  Problem Relation Age of Onset  . Hyperlipidemia Father   . Hypertension Father   . Stroke Father   . Depression Maternal Aunt   . Alcohol abuse Paternal Uncle   . Colon cancer Paternal Uncle   . Diabetes Paternal Uncle   . Diabetes Maternal Grandfather   . Colon cancer Paternal Grandfather      Current medication list and allergy/intolerance information reviewed:   Current Outpatient Prescriptions  Medication Sig Dispense Refill  . meloxicam (MOBIC) 7.5 MG tablet Take 1 tablet (7.5 mg total) by mouth 2 (two) times daily as needed for pain. 30 tablet 2  . ondansetron (ZOFRAN-ODT) 4 MG disintegrating tablet Take 4-8 mg by mouth every 6 (six) hours as needed. nausea  0  . sucralfate (CARAFATE) 1 g tablet Take 1 g by mouth 4 (four) times daily as needed. Indigestion      No current facility-administered medications for this visit.    Allergies  Allergen Reactions  . Amoxicillin       Review of Systems:  Constitutional:  No  fever, no chills, No recent illness, No unintentional weight changes. No significant fatigue.   HEENT: No  headache, no  vision change  Cardiac: No  chest pain, No  pressure  Respiratory:  No  shortness of breath.   Gastrointestinal: No  abdominal pain, No  nausea, No  vomiting,  No  blood in stool, No  diarrhea, No  constipation   Musculoskeletal: No new myalgia/arthralgia  Genitourinary: No  incontinence, No  abnormal genital bleeding, No abnormal genital discharge  Skin: No  Rash, No other wounds/concerning lesions  Neurologic: No  weakness, No  dizziness  Psychiatric: No  concerns with depression, No  concerns with anxiety, No sleep problems, No mood problems  Exam:  BP 121/76   Pulse 79   Ht 5\' 7"  (1.702 m)   Wt 226 lb (102.5 kg)   BMI 35.40 kg/m   Constitutional: VS see above. General Appearance: alert, well-developed, well-nourished, NAD  Eyes: Normal lids and conjunctive, non-icteric sclera  Ears, Nose, Mouth, Throat: MMM, Normal external inspection ears/nares/mouth/lips/gums. TM normal bilaterally. Pharynx/tonsils no erythema, no exudate. Nasal mucosa normal.   Neck: No masses, trachea midline. No thyroid enlargement. No tenderness/mass appreciated. No lymphadenopathy  Respiratory: Normal respiratory effort. no wheeze, no rhonchi, no rales  Cardiovascular: S1/S2 normal, no murmur, no rub/gallop auscultated. RRR. No lower extremity edema.   Gastrointestinal: Nontender, no masses. No hepatomegaly, no splenomegaly. No hernia appreciated. Bowel sounds normal. Rectal exam deferred.   Musculoskeletal: Gait normal. No clubbing/cyanosis of digits.   Neurological: Normal balance/coordination. No tremor.   Skin: warm, dry,  intact. No rash/ulcer.   Psychiatric: Normal judgment/insight. Normal mood and affect. Oriented x3.      ASSESSMENT/PLAN:   Annual physical exam - Plan: CBC with Differential/Platelet, COMPLETE METABOLIC PANEL WITH GFR, TSH, Lipid panel    MALE PREVENTIVE CARE updated 08/03/16  ANNUAL SCREENING/COUNSELING  Any changes to health in the past year?  no  Tobacco - no   Alcohol - social drinker, 6 - 10 beers once per week   Diet/Exercise - Healthy habits discussed to decrease CV risk and promote overall health. Patient does not have dietary restrictions other than spicy fod causes severe heartburn.   Depression - PQH2 Negative  Feel safe at home? - no  HTN SCREENING - SEE [place]  Sexually active? - Yes  STI testing needed/desired today? - no  Any concerns with testosterone/libido? - no  INFECTIOUS DISEASE SCREENING  HIV - does not need  GC/CT - does not need  HepC - does not need  TB - does not need  CANCER SCREENING  Lung - does not need  Colon - does not need, no 1st degree relative  Prostate - does not need  OTHER DISEASE SCREENING  Lipid - need  DM2 - need  Osteoporosis - does not need  ADULT VACCINATION  Influenza - needs today, annual vaccine recommended - will get at pharmacy  Td - was not indicated  Zoster - was not indicated  Pneumonia - was not indicated       Visit summary with medication list and pertinent instructions was printed for patient to review. All questions at time of visit were answered - patient instructed to contact office with any additional concerns. ER/RTC precautions were reviewed with the patient. Follow-up plan: Return in about 1 year (around 08/03/2017) for [place] .

## 2016-08-04 LAB — CBC WITH DIFFERENTIAL/PLATELET
BASOS ABS: 0 {cells}/uL (ref 0–200)
BASOS PCT: 0 %
EOS ABS: 95 {cells}/uL (ref 15–500)
EOS PCT: 1 %
HCT: 46.9 % (ref 38.5–50.0)
HEMOGLOBIN: 15.8 g/dL (ref 13.2–17.1)
LYMPHS ABS: 2850 {cells}/uL (ref 850–3900)
Lymphocytes Relative: 30 %
MCH: 28.2 pg (ref 27.0–33.0)
MCHC: 33.7 g/dL (ref 32.0–36.0)
MCV: 83.6 fL (ref 80.0–100.0)
MONOS PCT: 8 %
MPV: 9.7 fL (ref 7.5–12.5)
Monocytes Absolute: 760 cells/uL (ref 200–950)
NEUTROS ABS: 5795 {cells}/uL (ref 1500–7800)
Neutrophils Relative %: 61 %
PLATELETS: 315 10*3/uL (ref 140–400)
RBC: 5.61 MIL/uL (ref 4.20–5.80)
RDW: 13.3 % (ref 11.0–15.0)
WBC: 9.5 10*3/uL (ref 3.8–10.8)

## 2016-08-04 LAB — COMPLETE METABOLIC PANEL WITH GFR
ALBUMIN: 4.4 g/dL (ref 3.6–5.1)
ALK PHOS: 50 U/L (ref 40–115)
ALT: 21 U/L (ref 9–46)
AST: 20 U/L (ref 10–40)
BILIRUBIN TOTAL: 0.8 mg/dL (ref 0.2–1.2)
BUN: 15 mg/dL (ref 7–25)
CO2: 26 mmol/L (ref 20–31)
CREATININE: 0.98 mg/dL (ref 0.60–1.35)
Calcium: 9.1 mg/dL (ref 8.6–10.3)
Chloride: 103 mmol/L (ref 98–110)
GFR, Est African American: >89 mL/min (ref >=60)
GLUCOSE: 75 mg/dL (ref 65–99)
Potassium: 4.5 mmol/L (ref 3.5–5.3)
SODIUM: 140 mmol/L (ref 135–146)
TOTAL PROTEIN: 7.2 g/dL (ref 6.1–8.1)

## 2016-08-04 LAB — LIPID PANEL
Cholesterol: 169 mg/dL (ref 125–200)
HDL: 29 mg/dL — ABNORMAL LOW (ref >=40)
LDL CALC: 97 mg/dL (ref <130)
Total CHOL/HDL Ratio: 5.8 Ratio — ABNORMAL HIGH (ref <=5.0)
Triglycerides: 214 mg/dL — ABNORMAL HIGH (ref <150)
VLDL: 43 mg/dL — AB (ref <30)

## 2016-08-04 LAB — TSH: TSH: 1.66 mIU/L (ref 0.40–4.50)

## 2016-09-05 ENCOUNTER — Ambulatory Visit (INDEPENDENT_AMBULATORY_CARE_PROVIDER_SITE_OTHER): Payer: BLUE CROSS/BLUE SHIELD | Admitting: Osteopathic Medicine

## 2016-09-05 ENCOUNTER — Encounter: Payer: Self-pay | Admitting: Osteopathic Medicine

## 2016-09-05 VITALS — BP 131/82 | HR 87 | Temp 97.9°F | Ht 67.0 in | Wt 235.0 lb

## 2016-09-05 DIAGNOSIS — Z800 Family history of malignant neoplasm of digestive organs: Secondary | ICD-10-CM | POA: Diagnosis not present

## 2016-09-05 DIAGNOSIS — H6981 Other specified disorders of Eustachian tube, right ear: Secondary | ICD-10-CM

## 2016-09-05 DIAGNOSIS — J329 Chronic sinusitis, unspecified: Secondary | ICD-10-CM

## 2016-09-05 DIAGNOSIS — B9689 Other specified bacterial agents as the cause of diseases classified elsewhere: Secondary | ICD-10-CM

## 2016-09-05 MED ORDER — DOXYCYCLINE MONOHYDRATE 100 MG PO CAPS: 100.0000 mg | ORAL_CAPSULE | Freq: Two times a day (BID) | ORAL | 0 refills | Status: DC

## 2016-09-05 MED ORDER — IPRATROPIUM BROMIDE 0.03 % NA SOLN: 2.0000 | Freq: Three times a day (TID) | NASAL | 0 refills | Status: DC | PRN

## 2016-09-05 NOTE — Patient Instructions (Signed)
Ask about a family history of Lynch Syndrome. I can refer you to a GI doctor to discuss colonoscopy for you, but it is reassuring that your dad's colonoscopy was negative.   Take all antibiotics as directed, use nasal spray and Ibuprofen or Tylenol as well for pian/inflammation. If no better in 5 - 7 days, or if worse, let me know!

## 2016-09-05 NOTE — Progress Notes (Signed)
HPI: Nicholas Steele is a 35 y.o. male  who presents to [facility] today, 09/05/16,  for chief complaint of:  Chief Complaint  Patient presents with  . Headache  . Otalgia    Acute Illness  . Context:Recent upper respiratory illness, [place], significant ear pain . Location: Earaches, headache/sinus pressure, cough, worse on left . Quality: Pressure, underwater feeling in the ear . Duration: About a week or 2 altogether  . Assoc signs/symptoms: No hearing loss, no nausea or vomiting, reports subjective fever, sinus pressure/drainage worse on the left  Patient also has some questions about family history of colon cancer. Reviewed previous note and confirmed with patient. Grandfather was positive for colon cancer, his dad and multiple other relatives were tested, few uncles and father's cousins have colon cancer issues, no history of Lynch syndrome that he is aware of, his father's colonoscopy was negative.    Past medical, surgical, social and family history reviewed: Patient Active Problem List   Diagnosis Date Noted  . Abdominal pain, epigastric 02/23/2016  . Fatty liver 02/23/2016  . Hiatal hernia 02/23/2016  . Gallstones 02/23/2016  . Arthritis of spine ([place]) 02/23/2016  . Diverticulosis 02/23/2016  . Right-sided thoracic back pain 06/18/2015  . Spasm of back muscles 06/18/2015  . History of back injury 06/18/2015  . Cubital tunnel syndrome on right 06/18/2015  . Annual physical exam 05/21/2015  . Chronic fatigue 05/21/2015  . Lipid screening 05/21/2015  . Screening for HIV (human immunodeficiency virus) 05/21/2015  . Need for hepatitis C screening test 05/21/2015  . 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 HERNIA REPAIR     Social History  Substance Use Topics  . Smoking status: Never Smoker  . Smokeless tobacco: Never Used   . Alcohol use Yes   Family History  Problem Relation Age of Onset  . Hyperlipidemia Father   . Hypertension Father   . Stroke Father   . Depression Maternal Aunt   . Alcohol abuse Paternal Uncle   . Colon cancer Paternal Uncle   . Diabetes Paternal Uncle   . Diabetes Maternal Grandfather   . Colon cancer Paternal Grandfather      Current medication list and allergy/intolerance information reviewed:   Current Outpatient Prescriptions on File Prior to Visit  Medication Sig Dispense Refill  . meloxicam (MOBIC) 7.5 MG tablet Take 1 tablet (7.5 mg total) by mouth 2 (two) times daily as needed for pain. 30 tablet 2  . ondansetron (ZOFRAN-ODT) 4 MG disintegrating tablet Take 4-8 mg by mouth every 6 (six) hours as needed. nausea  0  . sucralfate (CARAFATE) 1 g tablet Take 1 g by mouth 4 (four) times daily as needed. Indigestion      No current facility-administered medications on file prior to visit.    Allergies  Allergen Reactions  . Amoxicillin       Review of Systems:  Constitutional: No recent illness  HEENT: No  headache, no vision change  Cardiac: No  chest pain, No  pressure, No palpitations  Respiratory:  No  shortness of breath. No  Cough  Gastrointestinal: No  abdominal pain, no change on bowel habits  Musculoskeletal: No new myalgia/arthralgia  Skin: No  Rash  Hem/Onc: No  easy bruising/bleeding, No  abnormal lumps/bumps  Neurologic: No  weakness, No  Dizziness  Psychiatric: No  concerns with depression, No  concerns with anxiety  Exam:  BP 131/82   Pulse 87   Temp 97.9 F (36.6 C) (Oral)   Ht 5\' 7"  (1.702 m)   Wt 235 lb (106.6 kg)   BMI 36.81 kg/m   Constitutional: VS see above. General Appearance: alert, well-developed, well-nourished, NAD  Eyes: Normal lids and conjunctive, non-icteric sclera  Ears, Nose, Mouth, Throat: MMM, Normal external inspection ears/nares/mouth/lips/gums. No pharyngeal erythema/exudate, normal nasal mucosa, no  lymphadenopathy. Tympanic membranes positive for clear/cloudy effusion on the left, no bulging tympanic membrane erythema  Neck: No masses, trachea midline.   Respiratory: Normal respiratory effort. no wheeze, no rhonchi, no rales  Cardiovascular: S1/S2 normal, no murmur, no rub/gallop auscultated. RRR.   Musculoskeletal: Gait normal. Symmetric and independent movement of all extremities  Neurological: Normal balance/coordination. No tremor.  Skin: warm, dry, intact.   Psychiatric: Normal judgment/insight. Normal mood and affect. Oriented x3.    ASSESSMENT/PLAN:    Bacterial sinusitis  Dysfunction of right eustachian tube  Family history of colon cancer - None in first-degree relative but multiple second-degree and other distant relatives. Offered GI referral, patient will check about Lynch syndrome history - Plan: Ambulatory referral to Gastroenterology    Patient Instructions  Ask about a family history of Lynch Syndrome. I can refer you to a GI doctor to discuss colonoscopy for you, but it is reassuring that your dad's colonoscopy was negative.   Take all antibiotics as directed, use nasal spray and Ibuprofen or Tylenol as well for pian/inflammation. If no better in 5 - 7 days, or if worse, let me know!     Visit summary with medication list and pertinent instructions was printed for patient to review. All questions at time of visit were answered - patient instructed to contact office with any additional concerns. ER/RTC precautions were reviewed with the patient. Follow-up plan: Return if symptoms worsen or fail to improve.

## 2016-09-07 ENCOUNTER — Encounter: Payer: Self-pay | Admitting: Physician Assistant

## 2016-09-18 ENCOUNTER — Ambulatory Visit (INDEPENDENT_AMBULATORY_CARE_PROVIDER_SITE_OTHER): Payer: BLUE CROSS/BLUE SHIELD | Admitting: Physician Assistant

## 2016-09-18 ENCOUNTER — Encounter: Payer: Self-pay | Admitting: Physician Assistant

## 2016-09-18 VITALS — BP 126/84 | Ht 67.0 in | Wt 231.2 lb

## 2016-09-18 DIAGNOSIS — Z800 Family history of malignant neoplasm of digestive organs: Secondary | ICD-10-CM | POA: Diagnosis not present

## 2016-09-18 DIAGNOSIS — K625 Hemorrhage of anus and rectum: Secondary | ICD-10-CM

## 2016-09-18 MED ORDER — NA SULFATE-K SULFATE-MG SULF 17.5-3.13-1.6 GM/177ML PO SOLN: 1.0000 | Freq: Once | ORAL | 0 refills | Status: AC

## 2016-09-18 NOTE — Progress Notes (Signed)
Subjective:    Patient ID: Nicholas Steele, male    DOB: [birth year], 35 y.o.   MRN: [id number]  HPI  Nicholas Steele is a 35 year old white male, referred today by Dr. Emeterio Reeve for consideration of colonoscopy. Patient has history of obesity, gallstones, fatty liver. He has family history of colon cancer in his paternal grandfather who was diagnosed in his 48s, grandfather also had 2 brothers with colon cancer. Patient's paternal uncle initially diagnosed with colon polyps in his 41s and another paternal uncle diagnosed with colon cancer in his 50s and is deceased secondary to colon cancer. There is also a paternal aunt with history of pancreatic cancer. Patient has no current complaints of abdominal pain, changes in bowel habits, but has noted bright red blood intermittently on the tissue over the past few years. He denies any rectal pain or discomfort has no known hemorrhoids.  Review of Systems Pertinent positive and negative review of systems were noted in the above HPI section.  All other review of systems was otherwise negative.  Outpatient Encounter Prescriptions as of 09/18/2016  Medication Sig  . ipratropium (ATROVENT) 0.03 % nasal spray Place 2 sprays into both nostrils 3 (three) times daily as needed for rhinitis.  . meloxicam (MOBIC) 7.5 MG tablet Take 1 tablet (7.5 mg total) by mouth 2 (two) times daily as needed for pain.  Marland Kitchen ondansetron (ZOFRAN-ODT) 4 MG disintegrating tablet Take 4-8 mg by mouth every 6 (six) hours as needed. nausea  . sucralfate (CARAFATE) 1 g tablet Take 1 g by mouth 4 (four) times daily as needed. Indigestion   . [DISCONTINUED] doxycycline (MONODOX) 100 MG capsule Take 1 capsule (100 mg total) by mouth 2 (two) times daily.  . Na Sulfate-K Sulfate-Mg Sulf 17.5-3.13-1.6 GM/180ML SOLN Take 1 kit by mouth once.   No facility-administered encounter medications on file as of 09/18/2016.    Allergies  Allergen Reactions  . Amoxicillin    Patient Active Problem List    Diagnosis Date Noted  . Abdominal pain, epigastric 02/23/2016  . Fatty liver 02/23/2016  . Hiatal hernia 02/23/2016  . Gallstones 02/23/2016  . Arthritis of spine ([place]) 02/23/2016  . Diverticulosis 02/23/2016  . Right-sided thoracic back pain 06/18/2015  . Spasm of back muscles 06/18/2015  . History of back injury 06/18/2015  . Cubital tunnel syndrome on right 06/18/2015  . Annual physical exam 05/21/2015  . Chronic fatigue 05/21/2015  . Lipid screening 05/21/2015  . Screening for HIV (human immunodeficiency virus) 05/21/2015  . Need for hepatitis C screening test 05/21/2015  . 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 Marital status: Single    Spouse name: N/A  . Number of children: 0  . Years of education: N/A   Occupational History  . Not on file.   Social History Main Topics  . Smoking status: Never Smoker  . Smokeless tobacco: Never Used  . Alcohol use Yes     Comment: Socially  . Drug use:     Types: Marijuana     Comment: In 20's  . Sexual activity: Not Currently   Other Topics Concern  . Not on file   Social History Narrative  . No narrative on file    Mr. Nicholas Steele's family history includes Alcohol abuse in his paternal uncle; Colon cancer in his paternal grandfather and paternal uncle; Depression in his maternal aunt; Diabetes in his maternal grandfather and paternal uncle; Hyperlipidemia in his father; Hypertension in his father;  Stroke in his father.      Objective:    Vitals:   09/18/16 1028  BP: 126/84    Physical Exam Well-developed white male in no acute distress, blood pressure 126/84, height 5 foot 7, weight 231, BMI 36.2. HEENT; nontraumatic, cephalic EOMI PERRLA sclera anicteric, Cardiovascular; regular rate and rhythm with S1-S2 no murmur or gallop, Pulmonary; clear bilaterally, Abdomen ;obese soft nontender nondistended bowel sounds are active there is no palpable mass or hepatosplenomegaly bowel  sounds are present, Rectal ;exam not done,Ext; no clubbing cyanosis or edema skin warm and dry, Neuropsych; mood and affect appropriate      Assessment & Plan:   #42 35 year old white male with intermittent small-volume hematochezia primarily noted on tissue over the past couple of years. Rule out hemorrhoidal versus occult lesion #2 strong family history of colon cancer in patient's paternal grandfather, 2 great uncles, 1 paternal uncle diagnosed in his 26s with colon cancer in 1 paternal uncle with colon polyps, 1 paternal aunt with pancreatic cancer. Patient does not have any immediate family members with colon cancer, has 2 second-degree relatives with colon cancer 1 diagnosed age less than 73.  Question [facility]NP[facility]   #3 obesity #4 fatty liver  #5 gallstones  Plan; Patient will be scheduled for colonoscopy with Dr. Fuller Plan. Procedure discussed in detail with patient including risks and benefits and he is agreeable to proceed.   Amy Genia Harold PA-C 09/18/2016   Cc: Emeterio Reeve, DO

## 2016-09-18 NOTE — Progress Notes (Signed)
Reviewed and agree with management plan.  Bodee Lafoe T. Bobak Oguinn, MD FACG 

## 2016-09-18 NOTE — Patient Instructions (Signed)

## 2016-11-20 ENCOUNTER — Encounter: Payer: Self-pay | Admitting: Gastroenterology

## 2016-11-20 ENCOUNTER — Ambulatory Visit (AMBULATORY_SURGERY_CENTER): Payer: BLUE CROSS/BLUE SHIELD | Admitting: Gastroenterology

## 2016-11-20 VITALS — BP 119/66 | HR 70 | Temp 98.4°F | Resp 16 | Ht 67.0 in | Wt 231.0 lb

## 2016-11-20 DIAGNOSIS — Z1211 Encounter for screening for malignant neoplasm of colon: Secondary | ICD-10-CM

## 2016-11-20 DIAGNOSIS — Z1212 Encounter for screening for malignant neoplasm of rectum: Secondary | ICD-10-CM

## 2016-11-20 DIAGNOSIS — D125 Benign neoplasm of sigmoid colon: Secondary | ICD-10-CM

## 2016-11-20 DIAGNOSIS — K635 Polyp of colon: Secondary | ICD-10-CM | POA: Diagnosis not present

## 2016-11-20 DIAGNOSIS — Z800 Family history of malignant neoplasm of digestive organs: Secondary | ICD-10-CM

## 2016-11-20 MED ORDER — SODIUM CHLORIDE 0.9 % IV SOLN: 500.0000 mL | INTRAVENOUS | Status: DC

## 2016-11-20 NOTE — Progress Notes (Signed)
Report to PACU, RN, vss, BBS= Clear.  

## 2016-11-20 NOTE — Patient Instructions (Signed)
YOU HAD AN ENDOSCOPIC PROCEDURE TODAY AT [facility]:   Refer to the procedure report that was given to you for any specific questions about what was found during the examination.  If the procedure report does not answer your questions, please call your gastroenterologist to clarify.  If you requested that your care partner not be given the details of your procedure findings, then the procedure report has been included in a sealed envelope for you to review at your convenience later.  YOU SHOULD EXPECT: Some feelings of bloating in the abdomen. Passage of more gas than usual.  Walking can help get rid of the air that was put into your GI tract during the procedure and reduce the bloating. If you had a lower endoscopy (such as a colonoscopy or flexible sigmoidoscopy) you may notice spotting of blood in your stool or on the toilet paper. If you underwent a bowel prep for your procedure, you may not have a normal bowel movement for a few days.  Please Note:  You might notice some irritation and congestion in your nose or some drainage.  This is from the oxygen used during your procedure.  There is no need for concern and it should clear up in a day or so.  SYMPTOMS TO REPORT IMMEDIATELY:   Following lower endoscopy (colonoscopy or flexible sigmoidoscopy):  Excessive amounts of blood in the stool  Significant tenderness or worsening of abdominal pains  Swelling of the abdomen that is new, acute  Fever of 100F or higher  For urgent or emergent issues, a gastroenterologist can be reached at any hour by calling [phone].   DIET:  We do recommend a small meal at first, but then you may proceed to your regular diet.  Drink plenty of fluids but you should avoid alcoholic beverages for 24 hours.  ACTIVITY:  You should plan to take it easy for the rest of today and you should NOT DRIVE or use heavy machinery until tomorrow (because of the sedation medicines used during the test).     FOLLOW UP: Our staff will call the number listed on your records the next business day following your procedure to check on you and address any questions or concerns that you may have regarding the information given to you following your procedure. If we do not reach you, we will leave a message.  However, if you are feeling well and you are not experiencing any problems, there is no need to return our call.  We will assume that you have returned to your regular daily activities without incident.  If any biopsies were taken you will be contacted by phone or by letter within the next 1-3 weeks.  Please call us at [phone] if you have not heard about the biopsies in 3 weeks.   Thank you for allowing [place] to provide for your healthcare today.   SIGNATURES/CONFIDENTIALITY: You and/or your care partner have signed paperwork which will be entered into your electronic medical record.  These signatures attest to the fact that that the information above on your After Visit Summary has been reviewed and is understood.  Full responsibility of the confidentiality of this discharge information lies with you and/or your care-partner.

## 2016-11-20 NOTE — Op Note (Signed)
[facility] Patient Name: Nicholas Steele Procedure Date: 11/20/2016 1:18 PM MRN: [id number] Endoscopist: Ladene Artist , MD Age: 36 Referring MD:  Date of Birth: May 13, 1981 Gender: Male Account #: [id number] Procedure:                Colonoscopy Indications:              Colon cancer screening in patient at increased                            risk: Family history of colorectal cancer in                            multiple 2nd degree relatives Medicines:                Monitored Anesthesia Care Procedure:                Pre-Anesthesia Assessment:                           - Prior to the procedure, a History and Physical                            was performed, and patient medications and                            allergies were reviewed. The patient's tolerance of                            previous anesthesia was also reviewed. The risks                            and benefits of the procedure and the sedation                            options and risks were discussed with the patient.                            All questions were answered, and informed consent                            was obtained. Prior Anticoagulants: The patient has                            taken no previous anticoagulant or antiplatelet                            agents. ASA Grade Assessment: II - A patient with                            mild systemic disease. After reviewing the risks                            and benefits, the patient was deemed in  satisfactory condition to undergo the procedure.                           After obtaining informed consent, the colonoscope                            was passed under direct vision. Throughout the                            procedure, the patient's blood pressure, pulse, and                            oxygen saturations were monitored continuously. The                            Model PCF-[id number]) scope was  introduced                            through the anus and advanced to the the cecum,                            identified by appendiceal orifice and ileocecal                            valve. The ileocecal valve, appendiceal orifice,                            and rectum were photographed. The quality of the                            bowel preparation was good. The colonoscopy was                            performed without difficulty. The patient tolerated                            the procedure well. Scope In: 1:23:11 PM Scope Out: 1:33:03 PM Scope Withdrawal Time: 0 hours 8 minutes 55 seconds  Total Procedure Duration: 0 hours 9 minutes 52 seconds  Findings:                 The perianal and digital rectal examinations were                            normal.                           A 6 mm polyp was found in the sigmoid colon. The                            polyp was sessile. The polyp was removed with a                            cold snare. Resection and retrieval were complete.  The exam was otherwise without abnormality on                            direct and retroflexion views. Complications:            No immediate complications. Estimated blood loss:                            None. Estimated Blood Loss:     Estimated blood loss: none. Impression:               - One 6 mm polyp in the sigmoid colon, removed with                            a cold snare. Resected and retrieved.                           - The examination was otherwise normal on direct                            and retroflexion views. Recommendation:           - Repeat colonoscopy in 5 years.                           - Patient has a contact number available for                            emergencies. The signs and symptoms of potential                            delayed complications were discussed with the                            patient. Return to normal activities tomorrow.                             Written discharge instructions were provided to the                            patient.                           - Resume previous diet.                           - Continue present medications.                           - Await pathology results. Ladene Artist, MD 11/20/2016 1:35:22 PM This report has been signed electronically.

## 2016-11-20 NOTE — Progress Notes (Signed)
Pt's states no medical or surgical changes since previsit or office visit. 

## 2016-11-20 NOTE — Progress Notes (Signed)
Called to room to assist during endoscopic procedure.  Patient ID and intended procedure confirmed with present staff. Received instructions for my participation in the procedure from the performing physician.  

## 2016-11-21 ENCOUNTER — Telehealth: Payer: Self-pay | Admitting: *Deleted

## 2016-11-21 NOTE — Telephone Encounter (Signed)
  Follow up Call-  Call back number 11/20/2016  Post procedure Call Back phone  # [phone]  Permission to leave phone message Yes  Some recent data might be hidden     Patient questions:  Do you have a fever, pain , or abdominal swelling? No. Pain Score  0 *  Have you tolerated food without any problems? Yes.    Have you been able to return to your normal activities? Yes.    Do you have any questions about your discharge instructions: Diet   No. Medications  No. Follow up visit  No.  Do you have questions or concerns about your Care? No.  Actions: * If pain score is 4 or above: No action needed, pain <4.

## 2016-11-28 ENCOUNTER — Encounter: Payer: Self-pay | Admitting: Gastroenterology

## 2017-06-15 ENCOUNTER — Encounter: Payer: Self-pay | Admitting: Osteopathic Medicine

## 2017-06-15 ENCOUNTER — Ambulatory Visit (INDEPENDENT_AMBULATORY_CARE_PROVIDER_SITE_OTHER): Payer: BLUE CROSS/BLUE SHIELD | Admitting: Osteopathic Medicine

## 2017-06-15 VITALS — BP 125/77 | HR 78 | Ht 67.0 in | Wt 231.0 lb

## 2017-06-15 DIAGNOSIS — W5501XD Bitten by cat, subsequent encounter: Secondary | ICD-10-CM

## 2017-06-15 DIAGNOSIS — Z0000 Encounter for general adult medical examination without abnormal findings: Secondary | ICD-10-CM

## 2017-06-15 DIAGNOSIS — S65311A Laceration of deep palmar arch of right hand, initial encounter: Secondary | ICD-10-CM | POA: Diagnosis not present

## 2017-06-15 DIAGNOSIS — W5501XA Bitten by cat, initial encounter: Secondary | ICD-10-CM | POA: Insufficient documentation

## 2017-06-15 NOTE — Patient Instructions (Signed)
Animal Bite Animal bite wounds can get infected. It is important to get proper medical treatment. Ask your doctor if you need rabies treatment. Follow these instructions at home: Wound care  Follow instructions from your doctor about how to take care of your wound. Make sure you: ? Wash your hands with soap and water before you change your bandage (dressing). If you cannot use soap and water, use hand sanitizer. ? Change your bandage as told by your doctor. ? Leave stitches (sutures), skin glue, or skin tape (adhesive) strips in place. They may need to stay in place for 2 weeks or longer. If tape strips get loose and curl up, you may trim the loose edges. Do not remove tape strips completely unless your doctor says it is okay.  Check your wound every day for signs of infection. Watch for: ? Redness, swelling, or pain that gets worse. ? Fluid, blood, or pus. General instructions  Take or apply over-the-counter and prescription medicines only as told by your doctor.  If you were prescribed an antibiotic, take or apply it as told by your doctor. Do not stop using the antibiotic even if your condition improves.  Keep the injured area raised (elevated) above the level of your heart while you are sitting or lying down.  If directed, apply ice to the injured area. ? Put ice in a plastic bag. ? Place a towel between your skin and the bag. ? Leave the ice on for 20 minutes, 2-3 times per day.  Keep all follow-up visits as told by your doctor. This is important. Contact a doctor if:  You have redness, swelling, or pain that gets worse.  You have a general feeling of sickness (malaise).  You feel sick to your stomach (nauseous).  You throw up (vomit).  You have pain that does not get better. Get help right away if:  You have a red streak going away from your wound.  You have fluid, blood, or pus coming from your wound.  You have a fever or chills.  You have trouble moving your  injured area.  You have numbness or tingling anywhere on your body. This information is not intended to replace advice given to you by your health care provider. Make sure you discuss any questions you have with your health care provider. Document Released: 09/25/2005 Document Revised: 03/02/2016 Document Reviewed: 02/10/2015 Elsevier Interactive Patient Education  2018 [employer]  

## 2017-06-15 NOTE — Progress Notes (Signed)
HPI: Nicholas Steele is a 36 y.o. male  who presents to [facility] today, 06/15/17,  for chief complaint of:  Chief Complaint  Patient presents with  . Follow-up    Seen in Urgent care for a cat bite     . Context: house cat bite - seen and treated in [place]. No records available  . Location: worse on R second finger, scratches on R forearm . Quality: Patient reports that swelling and redness as well as range of motion have dramatically improved . Duration: occurred 06/08/17 (7 days ago)  . Modifying factors: was seen 06/11/17 in urgent care and started antibiotics, he is still taking these on schedule, is not sure what the names of the medications are but it is 2 pills . Assoc signs/symptoms: No fever, no axillary lymphadenopathy    Past medical history, surgical history, social history and family history reviewed.  Patient Active Problem List   Diagnosis Date Noted  . Abdominal pain, epigastric 02/23/2016  . Fatty liver 02/23/2016  . Hiatal hernia 02/23/2016  . Gallstones 02/23/2016  . Arthritis of spine ([place]) 02/23/2016  . Diverticulosis 02/23/2016  . Right-sided thoracic back pain 06/18/2015  . Spasm of back muscles 06/18/2015  . History of back injury 06/18/2015  . Cubital tunnel syndrome on right 06/18/2015  . Annual physical exam 05/21/2015  . Chronic fatigue 05/21/2015  . Lipid screening 05/21/2015  . Screening for HIV (human immunodeficiency virus) 05/21/2015  . Need for hepatitis C screening test 05/21/2015  . 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   Current Outpatient Prescriptions on File Prior to Visit  Medication Sig Dispense Refill  . meloxicam (MOBIC) 7.5 MG tablet Take 1 tablet (7.5 mg total) by mouth 2 (two) times daily as needed for pain. 30 tablet 2  . ipratropium (ATROVENT) 0.03 % nasal spray Place 2 sprays into both nostrils 3  (three) times daily as needed for rhinitis. (Patient not taking: Reported on 06/15/2017) 30 mL 0  . ondansetron (ZOFRAN-ODT) 4 MG disintegrating tablet Take 4-8 mg by mouth every 6 (six) hours as needed. nausea  0  . sucralfate (CARAFATE) 1 g tablet Take 1 g by mouth 4 (four) times daily as needed. Indigestion      Current Facility-Administered Medications on File Prior to Visit  Medication Dose Route Frequency Provider Last Rate Last Dose  . 0.9 %  sodium chloride infusion  500 mL Intravenous Continuous Ladene Artist, MD       Allergies  Allergen Reactions  . Amoxicillin       Review of Systems:  Constitutional: No recent illness  HEENT: No  headache,  Cardiac: No  chest pain, No  pressure,   Respiratory:  No  shortness of breath.   Gastrointestinal: No  abdominal pain  Musculoskeletal: No new myalgia/arthralgia  Skin: No  Rash, bite wound on right second finger, scratches on right forearm  Neurologic: No  weakness, No  Dizziness   Exam:  BP 125/77   Pulse 78   Ht 5\' 7"  (1.702 m)   Wt 231 lb (104.8 kg)   BMI 36.18 kg/m   Constitutional: VS see above. General Appearance: alert, well-developed, well-nourished, NAD  Eyes: Normal lids and conjunctive, non-icteric sclera  Ears, Nose, Mouth, Throat: MMM, Normal external inspection ears/nares/mouth/lips/gums.  Neck: No masses, trachea midline.   Respiratory: Normal respiratory effort. no wheeze, no rhonchi, no rales  Cardiovascular: S1/S2 normal, no  murmur, no rub/gallop auscultated. RRR.   Musculoskeletal: Gait normal. Symmetric and independent movement of all extremities. Normal range of motion in fingers/wrist/although right arm  Neurological: Normal balance/coordination. No tremor.  Skin: warm, dry. Healing laceration on right second finger, mild pink edges consistent with healing but no erythema/edema  Psychiatric: Normal judgment/insight. Normal mood and affect. Oriented x3.       ASSESSMENT/PLAN:    Cat bite, subsequent encounter - Patient will call us with the names of the medications he is taking  Annual physical exam - Labs ordered for future visit, preventive care was not performed today - Plan: CBC, COMPLETE METABOLIC PANEL WITH GFR, Lipid panel    Patient Instructions  Animal Bite Animal bite wounds can get infected. It is important to get proper medical treatment. Ask your doctor if you need rabies treatment. Follow these instructions at home: Wound care  Follow instructions from your doctor about how to take care of your wound. Make sure you: ? Wash your hands with soap and water before you change your bandage (dressing). If you cannot use soap and water, use hand sanitizer. ? Change your bandage as told by your doctor. ? Leave stitches (sutures), skin glue, or skin tape (adhesive) strips in place. They may need to stay in place for 2 weeks or longer. If tape strips get loose and curl up, you may trim the loose edges. Do not remove tape strips completely unless your doctor says it is okay.  Check your wound every day for signs of infection. Watch for: ? Redness, swelling, or pain that gets worse. ? Fluid, blood, or pus. General instructions  Take or apply over-the-counter and prescription medicines only as told by your doctor.  If you were prescribed an antibiotic, take or apply it as told by your doctor. Do not stop using the antibiotic even if your condition improves.  Keep the injured area raised (elevated) above the level of your heart while you are sitting or lying down.  If directed, apply ice to the injured area. ? Put ice in a plastic bag. ? Place a towel between your skin and the bag. ? Leave the ice on for 20 minutes, 2-3 times per day.  Keep all follow-up visits as told by your doctor. This is important. Contact a doctor if:  You have redness, swelling, or pain that gets worse.  You have a general feeling of sickness (malaise).  You feel sick to your  stomach (nauseous).  You throw up (vomit).  You have pain that does not get better. Get help right away if:  You have a red streak going away from your wound.  You have fluid, blood, or pus coming from your wound.  You have a fever or chills.  You have trouble moving your injured area.  You have numbness or tingling anywhere on your body. This information is not intended to replace advice given to you by your health care provider. Make sure you discuss any questions you have with your health care provider. Document Released: 09/25/2005 Document Revised: 03/02/2016 Document Reviewed: 02/10/2015 Elsevier Interactive Patient Education  2018 [employer].     Follow-up plan: Return for [employer].  Visit summary with medication list and pertinent instructions was printed for patient to review, alert [place] if any changes needed. All questions at time of visit were answered - patient instructed to contact office with any additional concerns. ER/RTC precautions were reviewed with the patient and understanding verbalized.   Note: Total time  spent 25 minutes, greater than 50% of the visit was spent face-to-face counseling and coordinating care for the following: The primary encounter diagnosis was Cat bite, subsequent encounter.

## 2017-06-29 ENCOUNTER — Encounter: Payer: Self-pay | Admitting: Osteopathic Medicine

## 2017-06-29 ENCOUNTER — Ambulatory Visit (INDEPENDENT_AMBULATORY_CARE_PROVIDER_SITE_OTHER): Payer: BLUE CROSS/BLUE SHIELD | Admitting: Osteopathic Medicine

## 2017-06-29 VITALS — BP 115/76 | HR 85 | Ht 67.0 in | Wt 234.0 lb

## 2017-06-29 DIAGNOSIS — Z0000 Encounter for general adult medical examination without abnormal findings: Secondary | ICD-10-CM | POA: Diagnosis not present

## 2017-06-29 DIAGNOSIS — Z880 Allergy status to penicillin: Secondary | ICD-10-CM | POA: Diagnosis not present

## 2017-06-29 NOTE — Patient Instructions (Addendum)
If finger and heel is not better or if it gets worse, I would recommend follow-up with sports medicine specialist Dr. Dianah Field or Dr Georgina Snell for further evaluation in 2-4 weeks.

## 2017-06-29 NOTE — Progress Notes (Signed)
HPI: Nicholas Steele is a 36 y.o. male  who presents to [facility] today, 06/29/17,  for chief complaint of:  Chief Complaint  Patient presents with  . Annual Exam      Patient here for annual physical / wellness exam.  See preventive care reviewed as below.  No changes to family history Recent labs - patient not get labs prior to visit today.  Recent cat bite, healing well. Requests testing for amoxicillin allergy, states that his dad told him he had this allergy as child but doesn't know reaction or if he has ever had the medicine.   Past medical, surgical, social and family history reviewed: Patient Active Problem List   Diagnosis Date Noted  . Cat bite 06/15/2017  . Abdominal pain, epigastric 02/23/2016  . Fatty liver 02/23/2016  . Hiatal hernia 02/23/2016  . Gallstones 02/23/2016  . Arthritis of spine ([place]) 02/23/2016  . Diverticulosis 02/23/2016  . Right-sided thoracic back pain 06/18/2015  . Spasm of back muscles 06/18/2015  . History of back injury 06/18/2015  . Cubital tunnel syndrome on right 06/18/2015  . Annual physical exam 05/21/2015  . Chronic fatigue 05/21/2015  . Lipid screening 05/21/2015  . Screening for HIV (human immunodeficiency virus) 05/21/2015  . Need for hepatitis C screening test 05/21/2015  . 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 HERNIA REPAIR     Social History  Substance Use Topics  . Smoking status: Never Smoker  . Smokeless tobacco: Never Used  . Alcohol use Yes     Comment: Socially   Family History  Problem Relation Age of Onset  . Hyperlipidemia Father   . Hypertension Father   . Stroke Father   . Depression Maternal Aunt   . Alcohol abuse Paternal Uncle   . Colon cancer Paternal Uncle   . Diabetes Paternal Uncle   . Diabetes Maternal Grandfather   . Colon cancer Paternal Grandfather      Current medication list and  allergy/intolerance information reviewed:   Current Outpatient Prescriptions  Medication Sig Dispense Refill  . ipratropium (ATROVENT) 0.03 % nasal spray Place 2 sprays into both nostrils 3 (three) times daily as needed for rhinitis. 30 mL 0  . meloxicam (MOBIC) 7.5 MG tablet Take 1 tablet (7.5 mg total) by mouth 2 (two) times daily as needed for pain. 30 tablet 2  . ondansetron (ZOFRAN-ODT) 4 MG disintegrating tablet Take 4-8 mg by mouth every 6 (six) hours as needed. nausea  0  . sucralfate (CARAFATE) 1 g tablet Take 1 g by mouth 4 (four) times daily as needed. Indigestion      Current Facility-Administered Medications  Medication Dose Route Frequency Provider Last Rate Last Dose  . 0.9 %  sodium chloride infusion  500 mL Intravenous Continuous Ladene Artist, MD       Allergies  Allergen Reactions  . Amoxicillin       Review of Systems:  Constitutional:  No  fever, no chills, No recent illness, No unintentional weight changes. No significant fatigue.   HEENT: No  headache, no vision change  Cardiac: No  chest pain, No  pressure, No palpitations,  Respiratory:  No  shortness of breath. No  Cough  Gastrointestinal: No  abdominal pain, No  nausea, No  vomiting,  No  blood in stool, No  diarrhea, No  constipation   Musculoskeletal: No myalgia/arthralgia Except chronic heel pain, occasional finger pain.  Genitourinary: No  abnormal genital bleeding, No abnormal genital discharge  Skin: No  Rash, No other wounds/concerning lesions  Endocrine: No cold intolerance,  No heat intolerance.   Neurologic: No  weakness, No  dizziness  Psychiatric: No  concerns with depression, No  concerns with anxiety, No sleep problems, No mood problems  Exam:  BP 115/76   Pulse 85   Ht 5\' 7"  (1.702 m)   Wt 234 lb (106.1 kg)   BMI 36.65 kg/m   Constitutional: VS see above. General Appearance: alert, well-developed, well-nourished, NAD  Eyes: Normal lids and conjunctive, non-icteric  sclera  Ears, Nose, Mouth, Throat: MMM, Normal external inspection ears/nares/mouth/lips/gums. TM normal bilaterally. Pharynx/tonsils no erythema, no exudate. Nasal mucosa normal.   Neck: No masses, trachea midline. No thyroid enlargement. No tenderness/mass appreciated. No lymphadenopathy  Respiratory: Normal respiratory effort. no wheeze, no rhonchi, no rales  Cardiovascular: S1/S2 normal, no murmur, no rub/gallop auscultated. RRR. No lower extremity edema. Pedal pulse II/IV bilaterally DP and PT. No carotid bruit or JVD. No abdominal aortic bruit.  Gastrointestinal: Nontender, no masses. No hepatomegaly, no splenomegaly. No hernia appreciated. Bowel sounds normal. Rectal exam deferred.   Musculoskeletal: Gait normal. No clubbing/cyanosis of digits.   Neurological: Normal balance/coordination. No tremor. No cranial nerve deficit on limited exam. Motor and sensation intact and symmetric. Cerebellar reflexes intact.   Skin: warm, dry, intact. No rash/ulcer. No concerning nevi or subq nodules on limited exam.    Psychiatric: Normal judgment/insight. Normal mood and affect. Oriented x3.      ASSESSMENT/PLAN:   Annual physical exam - Plan: CBC, COMPLETE METABOLIC PANEL WITH GFR, Lipid panel, HIV antibody  Allergy to amoxicillin - Plan: Amoxicillin IgE   MALE PREVENTIVE CARE  updated 06/29/17  ANNUAL SCREENING/COUNSELING  Any changes to health in the past year? no  Diet/Exercise - HEALTHY HABITS DISCUSSED TO DECREASE CV RISK History  Smoking Status  . Never Smoker  Smokeless Tobacco  . Never Used   History  Alcohol Use  . Yes    Comment: Socially   Depression screen PHQ 2/9 06/29/2017  Decreased Interest 1  Down, Depressed, Hopeless 1  PHQ - 2 Score 2  Altered sleeping 2  Tired, decreased energy 1  Change in appetite 2  Feeling bad or failure about yourself  1  Trouble concentrating 0  Moving slowly or fidgety/restless 0  Suicidal thoughts 0  PHQ-9 Score 8   Difficult doing work/chores Not difficult at all    [place]  Sexually active in the past year? - Yes with male.  STI testing needed/desired today? - yes  Any concerns with testosterone/libido? - no  INFECTIOUS DISEASE SCREENING  HIV - needs  GC/CT - needs  HepC - does not need  TB - does not need  CANCER SCREENING  Lung - does not need  Colon - does not need  Prostate - does not need  OTHER DISEASE SCREENING  Lipid - needs  DM2 - needs  AAA - does not need  Osteoporosis - does not need  ADULT VACCINATION  Influenza - annual vaccine recommended  Td - booster every 10 years   Zoster - option at 76, yes at 60+   PCV13 - was not indicated  PPSV23 - was not indicated  There is no immunization history on file for this patient.   Patient Instructions  If finger and heel is not better or if it gets worse, I would recommend follow-up with sports medicine specialist Dr. Dianah Field for  further evaluation in 2-4 weeks.      Visit summary with medication list and pertinent instructions was printed for patient to review. All questions at time of visit were answered - patient instructed to contact office with any additional concerns. ER/RTC precautions were reviewed with the patient. Follow-up plan: Return for sports medicine followup: heel pain and finger issue .

## 2017-06-30 LAB — COMPLETE METABOLIC PANEL WITH GFR
AG Ratio: 1.6 (calc) (ref 1.0–2.5)
ALKALINE PHOSPHATASE (APISO): 46 U/L (ref 40–115)
ALT: 23 U/L (ref 9–46)
AST: 18 U/L (ref 10–40)
Albumin: 4.1 g/dL (ref 3.6–5.1)
BUN: 20 mg/dL (ref 7–25)
CHLORIDE: 108 mmol/L (ref 98–110)
CO2: 26 mmol/L (ref 20–32)
CREATININE: 0.98 mg/dL (ref 0.60–1.35)
Calcium: 9 mg/dL (ref 8.6–10.3)
GFR, Est African American: 114 mL/min/{1.73_m2} (ref >=60)
GFR, Est Non African American: 98 mL/min/{1.73_m2} (ref >=60)
GLOBULIN: 2.6 g/dL (ref 1.9–3.7)
GLUCOSE: 92 mg/dL (ref 65–99)
Potassium: 4 mmol/L (ref 3.5–5.3)
SODIUM: 141 mmol/L (ref 135–146)
Total Bilirubin: 0.6 mg/dL (ref 0.2–1.2)
Total Protein: 6.7 g/dL (ref 6.1–8.1)

## 2017-06-30 LAB — LIPID PANEL
CHOL/HDL RATIO: 7.2 (calc) — AB (ref <5.0)
CHOLESTEROL: 181 mg/dL (ref <200)
HDL: 25 mg/dL — AB (ref >40)
LDL Cholesterol (Calc): 108 mg/dL (calc) — ABNORMAL HIGH
Non-HDL Cholesterol (Calc): 156 mg/dL (calc) — ABNORMAL HIGH (ref <130)
Triglycerides: 357 mg/dL — ABNORMAL HIGH (ref <150)

## 2017-06-30 LAB — CBC
HCT: 44.8 % (ref 38.5–50.0)
Hemoglobin: 14.8 g/dL (ref 13.2–17.1)
MCH: 27.4 pg (ref 27.0–33.0)
MCHC: 33 g/dL (ref 32.0–36.0)
MCV: 83 fL (ref 80.0–100.0)
MPV: 9.8 fL (ref 7.5–12.5)
Platelets: 337 10*3/uL (ref 140–400)
RBC: 5.4 10*6/uL (ref 4.20–5.80)
RDW: 12.7 % (ref 11.0–15.0)
WBC: 10 10*3/uL (ref 3.8–10.8)

## 2017-06-30 LAB — HIV ANTIBODY (ROUTINE TESTING W REFLEX): HIV: NONREACTIVE

## 2017-07-03 LAB — ALLERGEN AMOXICILLIN: AMOXICILLIN IGE CLASS: 0

## 2017-07-13 ENCOUNTER — Ambulatory Visit (INDEPENDENT_AMBULATORY_CARE_PROVIDER_SITE_OTHER): Payer: BLUE CROSS/BLUE SHIELD

## 2017-07-13 ENCOUNTER — Encounter: Payer: Self-pay | Admitting: Family Medicine

## 2017-07-13 ENCOUNTER — Ambulatory Visit (INDEPENDENT_AMBULATORY_CARE_PROVIDER_SITE_OTHER): Payer: BLUE CROSS/BLUE SHIELD | Admitting: Family Medicine

## 2017-07-13 VITALS — BP 134/81 | HR 83 | Temp 98.2°F | Wt 234.0 lb

## 2017-07-13 DIAGNOSIS — S6992XA Unspecified injury of left wrist, hand and finger(s), initial encounter: Secondary | ICD-10-CM | POA: Diagnosis not present

## 2017-07-13 DIAGNOSIS — W268XXA Contact with other sharp object(s), not elsewhere classified, initial encounter: Secondary | ICD-10-CM | POA: Diagnosis not present

## 2017-07-13 DIAGNOSIS — S61241A Puncture wound with foreign body of left index finger without damage to nail, initial encounter: Secondary | ICD-10-CM | POA: Diagnosis not present

## 2017-07-13 DIAGNOSIS — S60552A Superficial foreign body of left hand, initial encounter: Secondary | ICD-10-CM

## 2017-07-13 DIAGNOSIS — M722 Plantar fascial fibromatosis: Secondary | ICD-10-CM | POA: Diagnosis not present

## 2017-07-13 DIAGNOSIS — M79645 Pain in left finger(s): Secondary | ICD-10-CM | POA: Diagnosis not present

## 2017-07-13 NOTE — Progress Notes (Signed)
   Subjective:    I'm seeing this patient as a consultation for:  Emeterio Reeve, DO   CC: BL heel pain and finger pain.   HPI:  Heel pain: Patient has bilateral heel pain present for 2 years worsening slowly. The pain has been worsening more recently over the last few months. The pain is worse with prolonged standing. Additionally he notes the pain is bad when he gets up in the morning. He has not tried much treatment yet. He denies any radiating pain weakness or numbness fevers or chills. He denies any injury.  Additionally patient notes pain in the left index finger. A few weeks ago he had a metallic piece of wire poke him in the left index finger. He removed the wire and had some pus that he was able to express later. He notes this is still mildly tender and is worried about having a retained foreign body. His tetanus is up-to-date and he denies any fevers or chills. He notes this is still mildly tender.  Past medical history, Surgical history, Family history not pertinant except as noted below, Social history, Allergies, and medications have been entered into the medical record, reviewed, and no changes needed.   Review of Systems: No headache, visual changes, nausea, vomiting, diarrhea, constipation, dizziness, abdominal pain, skin rash, fevers, chills, night sweats, weight loss, swollen lymph nodes, body aches, joint swelling, muscle aches, chest pain, shortness of breath, mood changes, visual or auditory hallucinations.   Objective:    Vitals:   07/13/17 0914  BP: 134/81  Pulse: 83  Temp: 98.2 F (36.8 C)  SpO2: 97%   General: Well Developed, well nourished, and in no acute distress.  Neuro/Psych: Alert and oriented x3, extra-ocular muscles intact, able to move all 4 extremities, sensation grossly intact. Skin: Warm and dry, no rashes noted.  Respiratory: Not using accessory muscles, speaking in full sentences, trachea midline.  Cardiovascular: Pulses palpable, no extremity  edema. Abdomen: Does not appear distended. MSK:  Feet bilaterally normal-appearing Tender palpation bilateral plantar calcaneus Pulses capillary refill and sensation are intact.  Left index finger with small well-appearing wound at the pad of the finger that is nontender. . No expressible pus.   Xray finger: Pending  No results found for this or any previous visit (from the past 24 hour(s)). No results found.  Impression and Recommendations:    Assessment and Plan: 36 y.o. male with BL Heel pain due to plantar fasciitis. Plan to treat with she'll heel cups eccentric heel exercises and ice massage. Recheck in one month.  Finger injury: X-ray pending for foreign body. Watchful waiting..   Orders Placed This Encounter  Procedures  . DG Finger Index Left    Order Specific Question:   Reason for exam:    Answer:   eval ? metalic forign body    Order Specific Question:   Preferred imaging location?    Answer:   [facility]   No orders of the defined types were placed in this encounter.   Discussed warning signs or symptoms. Please see discharge instructions. Patient expresses understanding.

## 2017-07-13 NOTE — Patient Instructions (Addendum)
Thank you for coming in today. For Plantar Fasciitis do the heel exercises 30 reps 3x daily. Remember to go slow.  Do the ice massage at night for about 20 mins both sides.  Use gel heel cups.   Recheck with me in 1 month.   For left index finger we will do an xray today to look for a piece of metal in the finger.   I will contact you with results.    Plantar Fasciitis Plantar fasciitis is a painful foot condition that affects the heel. It occurs when the band of tissue that connects the toes to the heel bone (plantar fascia) becomes irritated. This can happen after exercising too much or doing other repetitive activities (overuse injury). The pain from plantar fasciitis can range from mild irritation to severe pain that makes it difficult for you to walk or move. The pain is usually worse in the morning or after you have been sitting or lying down for a while. What are the causes? This condition may be caused by:  Standing for long periods of time.  Wearing shoes that do not fit.  Doing high-impact activities, including running, aerobics, and ballet.  Being overweight.  Having an abnormal way of walking (gait).  Having tight calf muscles.  Having high arches in your feet.  Starting a new athletic activity.  What are the signs or symptoms? The main symptom of this condition is heel pain. Other symptoms include:  Pain that gets worse after activity or exercise.  Pain that is worse in the morning or after resting.  Pain that goes away after you walk for a few minutes.  How is this diagnosed? This condition may be diagnosed based on your signs and symptoms. Your health care provider will also do a physical exam to check for:  A tender area on the bottom of your foot.  A high arch in your foot.  Pain when you move your foot.  Difficulty moving your foot.  You may also need to have imaging studies to confirm the diagnosis. These can  include:  X-rays.  Ultrasound.  MRI.  How is this treated? Treatment for plantar fasciitis depends on the severity of the condition. Your treatment may include:  Rest, ice, and over-the-counter pain medicines to manage your pain.  Exercises to stretch your calves and your plantar fascia.  A splint that holds your foot in a stretched, upward position while you sleep (night splint).  Physical therapy to relieve symptoms and prevent problems in the future.  Cortisone injections to relieve severe pain.  Extracorporeal shock wave therapy (ESWT) to stimulate damaged plantar fascia with electrical impulses. It is often used as a last resort before surgery.  Surgery, if other treatments have not worked after 12 months.  Follow these instructions at home:  Take medicines only as directed by your health care provider.  Avoid activities that cause pain.  Roll the bottom of your foot over a bag of ice or a bottle of cold water. Do this for 20 minutes, 3-4 times a day.  Perform simple stretches as directed by your health care provider.  Try wearing athletic shoes with air-sole or gel-sole cushions or soft shoe inserts.  Wear a night splint while sleeping, if directed by your health care provider.  Keep all follow-up appointments with your health care provider. How is this prevented?  Do not perform exercises or activities that cause heel pain.  Consider finding low-impact activities if you continue to have problems.  Lose weight if you need to. The best way to prevent plantar fasciitis is to avoid the activities that aggravate your plantar fascia. Contact a health care provider if:  Your symptoms do not go away after treatment with home care measures.  Your pain gets worse.  Your pain affects your ability to move or do your daily activities. This information is not intended to replace advice given to you by your health care provider. Make sure you discuss any questions you  have with your health care provider. Document Released: 06/20/2001 Document Revised: 02/28/2016 Document Reviewed: 08/05/2014 Elsevier Interactive Patient Education  [employer].

## 2017-07-23 ENCOUNTER — Encounter: Payer: Self-pay | Admitting: Family Medicine

## 2017-07-23 ENCOUNTER — Ambulatory Visit (INDEPENDENT_AMBULATORY_CARE_PROVIDER_SITE_OTHER): Payer: BLUE CROSS/BLUE SHIELD | Admitting: Family Medicine

## 2017-07-23 VITALS — BP 124/77 | HR 79 | Wt 238.0 lb

## 2017-07-23 DIAGNOSIS — S60459A Superficial foreign body of unspecified finger, initial encounter: Secondary | ICD-10-CM | POA: Diagnosis not present

## 2017-07-23 DIAGNOSIS — M722 Plantar fascial fibromatosis: Secondary | ICD-10-CM | POA: Diagnosis not present

## 2017-07-23 NOTE — Patient Instructions (Signed)
Thank you for coming in today. Use the gel heel cups. Let me know if you still feel the metal in your finger.  Recheck in 1 month or so.

## 2017-07-23 NOTE — Progress Notes (Signed)
Nicholas Steele is a 36 y.o. male who presents to [facility] today for follow-up plantar fasciitis and discuss metallic foreign body and index finger.  Patient was seen a few days ago for plantar fasciitis and metallic foreign body in the left index finger. The metallic foreign body is been present for weeks and was seen on x-ray on October 5. He is here today for removal of foreign body. He notes it's somewhat painful but he denies any fever or chills or erythema at the site.  Additionally he was seen for plantar fasciitis. He is started the exercises and has not felt much different.    Past Medical History:  Diagnosis Date  . Arthritis   . Asthma    Past Surgical History:  Procedure Laterality Date  . HERNIA REPAIR     Social History  Substance Use Topics  . Smoking status: Never Smoker  . Smokeless tobacco: Never Used  . Alcohol use Yes     Comment: Socially     ROS:  As above   Medications: Current Outpatient Prescriptions  Medication Sig Dispense Refill  . ipratropium (ATROVENT) 0.03 % nasal spray Place 2 sprays into both nostrils 3 (three) times daily as needed for rhinitis. 30 mL 0  . meloxicam (MOBIC) 7.5 MG tablet Take 1 tablet (7.5 mg total) by mouth 2 (two) times daily as needed for pain. 30 tablet 2  . ondansetron (ZOFRAN-ODT) 4 MG disintegrating tablet Take 4-8 mg by mouth every 6 (six) hours as needed. nausea  0  . sucralfate (CARAFATE) 1 g tablet Take 1 g by mouth 4 (four) times daily as needed. Indigestion      Current Facility-Administered Medications  Medication Dose Route Frequency Provider Last Rate Last Dose  . 0.9 %  sodium chloride infusion  500 mL Intravenous Continuous Ladene Artist, MD       Allergies  Allergen Reactions  . Amoxicillin      Exam:  BP 124/77   Pulse 79   Wt 238 lb (108 kg)   BMI 37.28 kg/m  General: Well Developed, well nourished, and in no acute distress.  Neuro/Psych:  Alert and oriented x3, extra-ocular muscles intact, able to move all 4 extremities, sensation grossly intact. Skin: Warm and dry, no rashes noted.  Respiratory: Not using accessory muscles, speaking in full sentences, trachea midline.  Cardiovascular: Pulses palpable, no extremity edema. Abdomen: Does not appear distended. MSK:  Left index finger with small healed wound at the pad of the finger. This is minimally tender.  Foreign body removal: Consent obtained and timeout performed. Finger cleaned with alcohol and chlorhexidine. Digital block given using 2 mL of lidocaine achieving good anesthesia. A tourniquet was applied around the finger. The tip of the finger was again sterilized with chlorhexidine. 2 small incisions were made at the healing puncture wound in a cross pattern. A sharp pair of forceps was used to try to probe the foreign body. The foreign body was felt and heard. However we were never able to definitively find the tiny metallic object seen on x-ray.  After discussion we elected to discontinue the procedure. Patient would like to do a bit of watchful waiting and declined xray today.  Patient tolerated procedure well. A Band-Aid was applied. Total tourniquet time less than 3 minutes.    No results found for this or any previous visit (from the past 48 hour(s)). No results found.    Assessment and Plan: 36 y.o. male  with  Metallic foreign body index finger. Likely removed today but not definitively identified. Repeat x-ray if patient continues to experience foreign body sensation.  Plantar fasciitis continue home exercise program. Recommend good high-quality gel heel cups. Recheck in November.    No orders of the defined types were placed in this encounter.  No orders of the defined types were placed in this encounter.   Discussed warning signs or symptoms. Please see discharge instructions. Patient expresses understanding.

## 2017-08-10 ENCOUNTER — Ambulatory Visit (INDEPENDENT_AMBULATORY_CARE_PROVIDER_SITE_OTHER): Payer: BLUE CROSS/BLUE SHIELD | Admitting: Family Medicine

## 2017-08-10 ENCOUNTER — Encounter: Payer: Self-pay | Admitting: Family Medicine

## 2017-08-10 ENCOUNTER — Ambulatory Visit: Payer: BLUE CROSS/BLUE SHIELD | Admitting: Family Medicine

## 2017-08-10 VITALS — BP 111/73 | HR 90 | Wt 241.0 lb

## 2017-08-10 DIAGNOSIS — S60459A Superficial foreign body of unspecified finger, initial encounter: Secondary | ICD-10-CM | POA: Diagnosis not present

## 2017-08-10 DIAGNOSIS — M722 Plantar fascial fibromatosis: Secondary | ICD-10-CM

## 2017-08-10 DIAGNOSIS — Z0189 Encounter for other specified special examinations: Secondary | ICD-10-CM

## 2017-08-10 NOTE — Progress Notes (Signed)
   Nicholas Steele is a 36 y.o. male who presents to [facility] today for follow-up plantar fasciitis and foreign body in finger.  Patient was seen October 5th for plantar fasciitis bilaterally.  The interim he is use gel heel cups at home exercise program and notes improving symptoms that are still mildly present.  He is able to work normally.  He feels well.  Additionally he was seen on 15 October metallic foreign body in his finger.  We did an incision and attempted to remove the foreign body.  Because it was so small we never were able to fully identify the metallic foreign body.  He is here today and notes that he is feeling fine and he no longer has a foreign body sensation in his finger.   Past Medical History:  Diagnosis Date  . Arthritis   . Asthma    Past Surgical History:  Procedure Laterality Date  . HERNIA REPAIR     Social History  Substance Use Topics  . Smoking status: Never Smoker  . Smokeless tobacco: Never Used  . Alcohol use Yes     Comment: Socially     ROS:  As above   Medications: Current Outpatient Prescriptions  Medication Sig Dispense Refill  . ipratropium (ATROVENT) 0.03 % nasal spray Place 2 sprays into both nostrils 3 (three) times daily as needed for rhinitis. 30 mL 0  . meloxicam (MOBIC) 7.5 MG tablet Take 1 tablet (7.5 mg total) by mouth 2 (two) times daily as needed for pain. 30 tablet 2  . ondansetron (ZOFRAN-ODT) 4 MG disintegrating tablet Take 4-8 mg by mouth every 6 (six) hours as needed. nausea  0  . sucralfate (CARAFATE) 1 g tablet Take 1 g by mouth 4 (four) times daily as needed. Indigestion      Current Facility-Administered Medications  Medication Dose Route Frequency Provider Last Rate Last Dose  . 0.9 %  sodium chloride infusion  500 mL Intravenous Continuous Ladene Artist, MD       Allergies  Allergen Reactions  . Amoxicillin      Exam:  BP 111/73   Pulse 90   Wt 241 lb  (109.3 kg)   BMI 37.75 kg/m  General: Well Developed, well nourished, and in no acute distress.  Neuro/Psych: Alert and oriented x3, extra-ocular muscles intact, able to move all 4 extremities, sensation grossly intact. Skin: Warm and dry, no rashes noted.  Respiratory: Not using accessory muscles, speaking in full sentences, trachea midline.  Cardiovascular: Pulses palpable, no extremity edema. Abdomen: Does not appear distended. MSK:  Feet bilaterally normal-appearing nontender normal motion.  Left index finger well-appearing incision nontender no discharge or erythema.    No results found for this or any previous visit (from the past 48 hour(s)). No results found.    Assessment and Plan: 36 y.o. male with  Letter fasciitis improving but still present.  Continue home exercise program and recheck as needed.  Metallic foreign body likely removed.  Patient is currently asymptomatic.  We will repeat x-rays if patient becomes symptomatic.  Check as needed.    No orders of the defined types were placed in this encounter.  No orders of the defined types were placed in this encounter.   Discussed warning signs or symptoms. Please see discharge instructions. Patient expresses understanding.

## 2017-08-10 NOTE — Patient Instructions (Signed)
Thank you for coming in today. Recheck if not better.  Return sooner if needed.  Work on those exercises.  Get thick gel heel cups as needed.  Return sooner if needed.    Plantar Fasciitis Plantar fasciitis is a painful foot condition that affects the heel. It occurs when the band of tissue that connects the toes to the heel bone (plantar fascia) becomes irritated. This can happen after exercising too much or doing other repetitive activities (overuse injury). The pain from plantar fasciitis can range from mild irritation to severe pain that makes it difficult for you to walk or move. The pain is usually worse in the morning or after you have been sitting or lying down for a while. What are the causes? This condition may be caused by:  Standing for long periods of time.  Wearing shoes that do not fit.  Doing high-impact activities, including running, aerobics, and ballet.  Being overweight.  Having an abnormal way of walking (gait).  Having tight calf muscles.  Having high arches in your feet.  Starting a new athletic activity.  What are the signs or symptoms? The main symptom of this condition is heel pain. Other symptoms include:  Pain that gets worse after activity or exercise.  Pain that is worse in the morning or after resting.  Pain that goes away after you walk for a few minutes.  How is this diagnosed? This condition may be diagnosed based on your signs and symptoms. Your health care provider will also do a physical exam to check for:  A tender area on the bottom of your foot.  A high arch in your foot.  Pain when you move your foot.  Difficulty moving your foot.  You may also need to have imaging studies to confirm the diagnosis. These can include:  X-rays.  Ultrasound.  MRI.  How is this treated? Treatment for plantar fasciitis depends on the severity of the condition. Your treatment may include:  Rest, ice, and over-the-counter pain medicines to  manage your pain.  Exercises to stretch your calves and your plantar fascia.  A splint that holds your foot in a stretched, upward position while you sleep (night splint).  Physical therapy to relieve symptoms and prevent problems in the future.  Cortisone injections to relieve severe pain.  Extracorporeal shock wave therapy (ESWT) to stimulate damaged plantar fascia with electrical impulses. It is often used as a last resort before surgery.  Surgery, if other treatments have not worked after 12 months.  Follow these instructions at home:  Take medicines only as directed by your health care provider.  Avoid activities that cause pain.  Roll the bottom of your foot over a bag of ice or a bottle of cold water. Do this for 20 minutes, 3-4 times a day.  Perform simple stretches as directed by your health care provider.  Try wearing athletic shoes with air-sole or gel-sole cushions or soft shoe inserts.  Wear a night splint while sleeping, if directed by your health care provider.  Keep all follow-up appointments with your health care provider. How is this prevented?  Do not perform exercises or activities that cause heel pain.  Consider finding low-impact activities if you continue to have problems.  Lose weight if you need to. The best way to prevent plantar fasciitis is to avoid the activities that aggravate your plantar fascia. Contact a health care provider if:  Your symptoms do not go away after treatment with home care measures.  Your pain gets worse.  Your pain affects your ability to move or do your daily activities. This information is not intended to replace advice given to you by your health care provider. Make sure you discuss any questions you have with your health care provider. Document Released: 06/20/2001 Document Revised: 02/28/2016 Document Reviewed: 08/05/2014 Elsevier Interactive Patient Education  [employer].

## 2018-05-19 DIAGNOSIS — S61432A Puncture wound without foreign body of left hand, initial encounter: Secondary | ICD-10-CM | POA: Diagnosis not present

## 2018-05-19 DIAGNOSIS — W268XXA Contact with other sharp object(s), not elsewhere classified, initial encounter: Secondary | ICD-10-CM | POA: Diagnosis not present

## 2018-05-19 DIAGNOSIS — Z23 Encounter for immunization: Secondary | ICD-10-CM | POA: Diagnosis not present

## 2018-05-19 DIAGNOSIS — Y999 Unspecified external cause status: Secondary | ICD-10-CM | POA: Diagnosis not present

## 2018-06-18 ENCOUNTER — Encounter: Payer: Self-pay | Admitting: Osteopathic Medicine

## 2018-06-18 ENCOUNTER — Ambulatory Visit (INDEPENDENT_AMBULATORY_CARE_PROVIDER_SITE_OTHER): Payer: BLUE CROSS/BLUE SHIELD | Admitting: Osteopathic Medicine

## 2018-06-18 VITALS — BP 141/83 | HR 75 | Temp 98.0°F | Wt 244.1 lb

## 2018-06-18 DIAGNOSIS — L237 Allergic contact dermatitis due to plants, except food: Secondary | ICD-10-CM

## 2018-06-18 DIAGNOSIS — Z0000 Encounter for general adult medical examination without abnormal findings: Secondary | ICD-10-CM

## 2018-06-18 LAB — COMPLETE METABOLIC PANEL WITH GFR
AG Ratio: 1.6 (calc) (ref 1.0–2.5)
ALBUMIN MSPROF: 4.2 g/dL (ref 3.6–5.1)
ALT: 29 U/L (ref 9–46)
AST: 23 U/L (ref 10–40)
Alkaline phosphatase (APISO): 49 U/L (ref 40–115)
BUN: 13 mg/dL (ref 7–25)
CALCIUM: 9.4 mg/dL (ref 8.6–10.3)
CO2: 27 mmol/L (ref 20–32)
Chloride: 104 mmol/L (ref 98–110)
Creat: 0.92 mg/dL (ref 0.60–1.35)
GFR, EST AFRICAN AMERICAN: 122 mL/min/{1.73_m2} (ref >=60)
GFR, EST NON AFRICAN AMERICAN: 105 mL/min/{1.73_m2} (ref >=60)
GLUCOSE: 85 mg/dL (ref 65–139)
Globulin: 2.7 g/dL (calc) (ref 1.9–3.7)
Potassium: 4.1 mmol/L (ref 3.5–5.3)
Sodium: 139 mmol/L (ref 135–146)
TOTAL PROTEIN: 6.9 g/dL (ref 6.1–8.1)
Total Bilirubin: 0.5 mg/dL (ref 0.2–1.2)

## 2018-06-18 LAB — CBC
HCT: 46.4 % (ref 38.5–50.0)
Hemoglobin: 15.3 g/dL (ref 13.2–17.1)
MCH: 27.7 pg (ref 27.0–33.0)
MCHC: 33 g/dL (ref 32.0–36.0)
MCV: 83.9 fL (ref 80.0–100.0)
MPV: 10 fL (ref 7.5–12.5)
PLATELETS: 357 10*3/uL (ref 140–400)
RBC: 5.53 10*6/uL (ref 4.20–5.80)
RDW: 12.9 % (ref 11.0–15.0)
WBC: 9.5 10*3/uL (ref 3.8–10.8)

## 2018-06-18 LAB — LIPID PANEL
CHOL/HDL RATIO: 6.6 (calc) — AB (ref <5.0)
Cholesterol: 192 mg/dL (ref <200)
HDL: 29 mg/dL — ABNORMAL LOW (ref >40)
LDL CHOLESTEROL (CALC): 124 mg/dL — AB
NON-HDL CHOLESTEROL (CALC): 163 mg/dL — AB (ref <130)
TRIGLYCERIDES: 256 mg/dL — AB (ref <150)

## 2018-06-18 MED ORDER — BETAMETHASONE DIPROPIONATE 0.05 % EX OINT: TOPICAL_OINTMENT | Freq: Two times a day (BID) | CUTANEOUS | 0 refills | Status: DC

## 2018-06-18 NOTE — Progress Notes (Signed)
HPI: Nicholas Steele is a 37 y.o. male who  has a past medical history of Arthritis and Asthma.  he presents to [facility] today, 06/18/18,  for chief complaint of:  Rash  Rash on L arm, poison ivy exposure. Itching, not getting better w/ hot water washes and antibiotic ointment...    Past medical history, surgical history, and family history reviewed.  Current medication list and allergy/intolerance information reviewed.   (See remainder of HPI, ROS, Phys Exam below)   ASSESSMENT/PLAN: The primary encounter diagnosis was Poison ivy dermatitis. A diagnosis of Annual physical exam was also pertinent to this visit.   Labs ordered for annual - preventive care was not performed/billed today.    Meds ordered this encounter  Medications  . betamethasone dipropionate (DIPROLENE) 0.05 % ointment    Sig: Apply topically 2 (two) times daily. To affected area(s) as needed    Dispense:  45 g    Refill:  0    Patient Instructions  Will try steroids for the itching/rash     Follow-up plan: Return if symptoms worsen or fail to improve.     ############################################ ############################################ ############################################ ############################################    Outpatient Encounter Medications as of 06/18/2018  Medication Sig Note  . ipratropium (ATROVENT) 0.03 % nasal spray Place 2 sprays into both nostrils 3 (three) times daily as needed for rhinitis. (Patient not taking: Reported on 06/18/2018)   . meloxicam (MOBIC) 7.5 MG tablet Take 1 tablet (7.5 mg total) by mouth 2 (two) times daily as needed for pain. (Patient not taking: Reported on 06/18/2018)   . ondansetron (ZOFRAN-ODT) 4 MG disintegrating tablet Take 4-8 mg by mouth every 6 (six) hours as needed. nausea 02/23/2016: Received from: External Pharmacy  . sucralfate (CARAFATE) 1 g tablet Take 1 g by mouth 4 (four) times daily as  needed. Indigestion  02/23/2016: Received from: [facility]   Facility-Administered Encounter Medications as of 06/18/2018  Medication  . 0.9 %  sodium chloride infusion   Allergies  Allergen Reactions  . Amoxicillin Other (See Comments)  . Amoxicillin       Review of Systems:  Constitutional: No recent illness  Musculoskeletal: No new myalgia/arthralgia  Skin: +Rash   Exam:  BP (!) 141/83 (BP Location: Left Arm, Patient Position: Sitting, Cuff Size: Large)   Pulse 75   Temp 98 F (36.7 C) (Oral)   Wt 244 lb 1.6 oz (110.7 kg)   BMI 38.23 kg/m   Constitutional: VS see above. General Appearance: alert, well-developed, well-nourished, NAD   Respiratory: Normal respiratory effort.   Musculoskeletal: Gait normal. Symmetric and independent movement of all extremities  Neurological: Normal balance/coordination. No tremor.  Skin: warm, dry, intact. Small vesicular rash on L forearm c/w poison ivy dermatitis  Psychiatric: Normal judgment/insight. Normal mood and affect. Oriented x3.   Visit summary with medication list and pertinent instructions was printed for patient to review, advised to alert [place] if any changes needed. All questions at time of visit were answered - patient instructed to contact office with any additional concerns. ER/RTC precautions were reviewed with the patient and understanding verbalized.   Follow-up plan: Return if symptoms worsen or fail to improve.    Please note: voice recognition software was used to produce this document, and typos may escape review. Please contact Dr. Sheppard Coil for any needed clarifications.

## 2018-06-18 NOTE — Patient Instructions (Signed)
Will try steroids for the itching/rash

## 2018-07-10 ENCOUNTER — Encounter: Payer: BLUE CROSS/BLUE SHIELD | Admitting: Osteopathic Medicine

## 2018-07-22 ENCOUNTER — Encounter: Payer: BLUE CROSS/BLUE SHIELD | Admitting: Osteopathic Medicine

## 2018-07-25 ENCOUNTER — Encounter: Payer: BLUE CROSS/BLUE SHIELD | Admitting: Osteopathic Medicine

## 2018-08-02 ENCOUNTER — Ambulatory Visit (INDEPENDENT_AMBULATORY_CARE_PROVIDER_SITE_OTHER): Payer: BLUE CROSS/BLUE SHIELD | Admitting: Osteopathic Medicine

## 2018-08-02 ENCOUNTER — Encounter: Payer: Self-pay | Admitting: Osteopathic Medicine

## 2018-08-02 VITALS — BP 126/74 | HR 77 | Temp 97.8°F | Wt 242.9 lb

## 2018-08-02 DIAGNOSIS — Z0000 Encounter for general adult medical examination without abnormal findings: Secondary | ICD-10-CM | POA: Diagnosis not present

## 2018-08-02 DIAGNOSIS — K589 Irritable bowel syndrome without diarrhea: Secondary | ICD-10-CM | POA: Diagnosis not present

## 2018-08-02 DIAGNOSIS — Z23 Encounter for immunization: Secondary | ICD-10-CM | POA: Diagnosis not present

## 2018-08-02 MED ORDER — LOPERAMIDE HCL 2 MG PO TABS: 2.0000 mg | ORAL_TABLET | Freq: Four times a day (QID) | ORAL | 0 refills | Status: DC | PRN

## 2018-08-02 MED ORDER — ONDANSETRON HCL 8 MG PO TABS: 8.0000 mg | ORAL_TABLET | Freq: Three times a day (TID) | ORAL | 0 refills | Status: DC | PRN

## 2018-08-02 MED ORDER — RIFAXIMIN 550 MG PO TABS: 550.0000 mg | ORAL_TABLET | Freq: Two times a day (BID) | ORAL | 0 refills | Status: DC

## 2018-08-02 NOTE — Progress Notes (Signed)
HPI: Nicholas Steele is a 37 y.o. male who  has a past medical history of Arthritis and Asthma.  he presents to [facility] today, 08/02/18,  for chief complaint of: Annual physical GI issues    Patient here for annual physical / wellness exam.  See preventive care reviewed as below.  Recent labs reviewed in detail with the patient.   Additional concerns today include:  Frequent loose stool . Context: Saw GI a few years ago, family history of colon cancer and he had polyps, he did not really have the issue with the loose stool at that point but no evidence of inflammatory bowel disease on colonoscopy.  Patient seems to recall a history of pancreatitis problems, on record review I can only find a lipase 104 measured in ER 02/10/2016 CT abdomen showed no pancreatic abnormality on that date either . Quality: Loose stool and fecal urgency usually a few times per day . Duration: More than a year . Modifying factors: Some dietary modifications, trying to eat less fat, healthier diet, does not really seem to be making much difference. . Assoc signs/symptoms: No bloody stool no fever or weight loss    Past medical, surgical, social and family history reviewed:  Patient Active Problem List   Diagnosis Date Noted  . Plantar fasciitis 07/23/2017  . Foreign body in skin of finger 07/23/2017  . Cat bite 06/15/2017  . Abdominal pain, epigastric 02/23/2016  . Fatty liver 02/23/2016  . Hiatal hernia 02/23/2016  . Gallstones 02/23/2016  . 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 Diverticulosis 02/23/2016  . Right-sided thoracic back pain 06/18/2015  . Spasm of back muscles 06/18/2015  . History of back injury 06/18/2015  . Cubital tunnel syndrome on right 06/18/2015  . Annual physical exam 05/21/2015  . Chronic fatigue 05/21/2015  . Lipid screening 05/21/2015  . Screening for HIV (human immunodeficiency virus) 05/21/2015  . Need for hepatitis C screening test  05/21/2015  . 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 HERNIA REPAIR      Social History   Tobacco Use  . Smoking status: Never Smoker  . Smokeless tobacco: Never Used  Substance Use Topics  . Alcohol use: Yes    Comment: Socially    Family History  Problem Relation Age of Onset  . Hyperlipidemia Father   . Hypertension Father   . Stroke Father   . Depression Maternal Aunt   . Alcohol abuse Paternal Uncle   . Colon cancer Paternal Uncle   . Diabetes Paternal Uncle   . Diabetes Maternal Grandfather   . Colon cancer Paternal Grandfather      Current medication list and allergy/intolerance information reviewed:    Current Outpatient Medications  Medication Sig Dispense Refill  . betamethasone dipropionate (DIPROLENE) 0.05 % ointment Apply topically 2 (two) times daily. To affected area(s) as needed 45 g 0  . ipratropium (ATROVENT) 0.03 % nasal spray Place 2 sprays into both nostrils 3 (three) times daily as needed for rhinitis. (Patient not taking: Reported on 06/18/2018) 30 mL 0  . meloxicam (MOBIC) 7.5 MG tablet Take 1 tablet (7.5 mg total) by mouth 2 (two) times daily as needed for pain. (Patient not taking: Reported on 06/18/2018) 30 tablet 2  . ondansetron (ZOFRAN-ODT) 4 MG disintegrating tablet Take 4-8 mg by mouth every 6 (six) hours as needed. nausea  0  . sucralfate (CARAFATE) 1 g tablet Take 1 g by  mouth 4 (four) times daily as needed. Indigestion      Current Facility-Administered Medications  Medication Dose Route Frequency Provider Last Rate Last Dose  . 0.9 %  sodium chloride infusion  500 mL Intravenous Continuous Ladene Artist, MD        Allergies  Allergen Reactions  . Amoxicillin Other (See Comments)  . Amoxicillin       Review of Systems:  Constitutional:  No  fever, no chills, No recent illness, No unintentional weight changes. No significant fatigue.   HEENT: No  headache, no  vision change, no hearing change, No sore throat, No  sinus pressure  Cardiac: No  chest pain, No  pressure, No palpitations, No  Orthopnea  Respiratory:  No  shortness of breath. No  Cough  Gastrointestinal: +abdominal pain, No  nausea, No  vomiting,  No  blood in stool, +diarrhea, No  constipation   Musculoskeletal: No new myalgia/arthralgia  Skin: No  Rash, No other wounds/concerning lesions  Genitourinary: No  incontinence, No  abnormal genital bleeding, No abnormal genital discharge  Hem/Onc: No  easy bruising/bleeding, No  abnormal lymph node  Endocrine: No cold intolerance,  No heat intolerance. No polyuria/polydipsia/polyphagia   Neurologic: No  weakness, No  dizziness, No  slurred speech/focal weakness/facial droop  Psychiatric: No  concerns with depression, No  concerns with anxiety, No sleep problems, No mood problems  Exam:  BP 126/74 (BP Location: Left Arm, Patient Position: Sitting, Cuff Size: Normal)   Pulse 77   Temp 97.8 F (36.6 C) (Oral)   Wt 242 lb 14.4 oz (110.2 kg)   BMI 38.04 kg/m   Constitutional: VS see above. General Appearance: alert, well-developed, well-nourished, NAD  Eyes: Normal lids and conjunctive, non-icteric sclera  Ears, Nose, Mouth, Throat: MMM, Normal external inspection ears/nares/mouth/lips/gums. TM normal bilaterally. Pharynx/tonsils no erythema, no exudate. Nasal mucosa normal.   Neck: No masses, trachea midline. No thyroid enlargement. No tenderness/mass appreciated. No lymphadenopathy  Respiratory: Normal respiratory effort. no wheeze, no rhonchi, no rales  Cardiovascular: S1/S2 normal, no murmur, no rub/gallop auscultated. RRR. No lower extremity edema.   Gastrointestinal: Nontender, no masses. No hepatomegaly, no splenomegaly. No hernia appreciated. Bowel sounds normal. Rectal exam deferred.   Musculoskeletal: Gait normal. No clubbing/cyanosis of digits.   Neurological: Normal balance/coordination. No tremor. No cranial  nerve deficit on limited exam. Motor and sensation intact and symmetric. Cerebellar reflexes intact.   Skin: warm, dry, intact. No rash/ulcer. No concerning nevi or subq nodules on limited exam.    Psychiatric: Normal judgment/insight. Normal mood and affect. Oriented x3.     ASSESSMENT/PLAN:   Annual physical exam  Need for influenza vaccination - Plan: Flu Vaccine QUAD 6+ mos PF IM (Fluarix Quad PF)  Symptoms consistent with irritable bowel syndrome - Patient advised to schedule follow-up with GI.  Will check lipase again today.  Will hold off on Viberzi (?pancreas), trial Xifaxan - Plan: Lipase, COMPLETE METABOLIC PANEL WITH GFR    Patient Instructions  General Preventive Care  Most recent routine screening lipids/other labs:  Borderline cholesterol, work on lowering triglycerides with low fat, high fiber diet and increase exercise as tolerated   Sugar was good, no diabetes   All other labs were normal!   Everyone should have blood pressure checked once per year. Yours looks good!   Tobacco: don't! Alcohol: responsible moderation is ok for most adults - if you have concerns about your alcohol intake, please talk to me! Recreational/Illicit Drugs: don't!  Exercise: as  tolerated to reduce risk of cardiovascular disease and diabetes. Strength training will also prevent osteoporosis.   Mental health: if need for mental health care (medicines, counseling, other), or concerns about moods, please let me know!   Sexual health: if need for STD testing, or if concerns with libido/pain problems, please let me know!  Vaccines  Flu vaccine: recommended for almost everyone, every fall (by Halloween! Flu is scary!)  Shingles vaccine: after age 48   Pneumonia vaccines: after age 19  Tetanus booster: Tdap recommended every 10 years - last done 05/19/2018 Cancer screenings   Colon cancer screening: recommended for everyone at age 71, but some folks need a colonoscopy sooner if risk  factors   Prostate cancer screening: recommendations vary, optional PSA blood test for men around age 50 Infection screenings . HIV: recommended screening at least once age 5-65 . Gonorrhea/Chlamydia: screening as needed . Hepatitis C: recommended for anyone born 09-1965 . TB: certain at-risk populations, or depending on work requirements and/or travel history Other . Bone Density Test: recommended for men at age 46, sooner depending on risk factors . Advanced Directive: Living Will and/or Healthcare Power of Attorney recommended for all adults, regardless of age or health!    Immunization History  Administered Date(s) Administered  . Influenza,inj,Quad PF,6+ Mos 08/02/2018  . Influenza-Unspecified 06/15/2017  . Tdap 05/19/2018    Recent Results (from the past 2160 hour(s))  CBC     Status: None   Collection Time: 06/18/18 11:47 AM  Result Value Ref Range   WBC 9.5 3.8 - 10.8 Thousand/uL   RBC 5.53 4.20 - 5.80 Million/uL   Hemoglobin 15.3 13.2 - 17.1 g/dL   HCT 46.4 38.5 - 50.0 %   MCV 83.9 80.0 - 100.0 fL   MCH 27.7 27.0 - 33.0 pg   MCHC 33.0 32.0 - 36.0 g/dL   RDW 12.9 11.0 - 15.0 %   Platelets 357 140 - 400 Thousand/uL   MPV 10.0 7.5 - 12.5 fL  COMPLETE METABOLIC PANEL WITH GFR     Status: None   Collection Time: 06/18/18 11:47 AM  Result Value Ref Range   Glucose, Bld 85 65 - 139 mg/dL    Comment: .        Non-fasting reference interval .    BUN 13 7 - 25 mg/dL   Creat 0.92 0.60 - 1.35 mg/dL   GFR, Est Non African American 105 > OR = 60 mL/min/1.37m2   GFR, Est African American 122 > OR = 60 mL/min/1.23m2   BUN/Creatinine Ratio NOT APPLICABLE 6 - 22 (calc)   Sodium 139 135 - 146 mmol/L   Potassium 4.1 3.5 - 5.3 mmol/L   Chloride 104 98 - 110 mmol/L   CO2 27 20 - 32 mmol/L   Calcium 9.4 8.6 - 10.3 mg/dL   Total Protein 6.9 6.1 - 8.1 g/dL   Albumin 4.2 3.6 - 5.1 g/dL   Globulin 2.7 1.9 - 3.7 g/dL (calc)   AG Ratio 1.6 1.0 - 2.5 (calc)   Total Bilirubin  0.5 0.2 - 1.2 mg/dL   Alkaline phosphatase (APISO) 49 40 - 115 U/L   AST 23 10 - 40 U/L   ALT 29 9 - 46 U/L  Lipid panel     Status: Abnormal   Collection Time: 06/18/18 11:47 AM  Result Value Ref Range   Cholesterol 192 <200 mg/dL   HDL 29 (L) >40 mg/dL   Triglycerides 256 (H) <150 mg/dL    Comment: . If  a non-fasting specimen was collected, consider repeat triglyceride testing on a fasting specimen if clinically indicated.  Yates Decamp et al. J. of Clin. Lipidol. 6754;4:920-100. Marland Kitchen    LDL Cholesterol (Calc) 124 (H) mg/dL (calc)    Comment: Reference range: <100 . Desirable range <100 mg/dL for primary prevention;   <70 mg/dL for patients with CHD or diabetic patients  with > or = 2 CHD risk factors. Marland Kitchen LDL-C is now calculated using the Martin-Hopkins  calculation, which is a validated novel method providing  better accuracy than the Friedewald equation in the  estimation of LDL-C.  Cresenciano Genre et al. Annamaria Helling. 7121;975(88): 2061-2068  (http://education.[facility].com/faq/FAQ164)    Total CHOL/HDL Ratio 6.6 (H) <5.0 (calc)   Non-HDL Cholesterol (Calc) 163 (H) <130 mg/dL (calc)    Comment: For patients with diabetes plus 1 major ASCVD risk  factor, treating to a non-HDL-C goal of <100 mg/dL  (LDL-C of <70 mg/dL) is considered a therapeutic  option.      Visit summary with medication list and pertinent instructions was printed for patient to review. All questions at time of visit were answered - patient instructed to contact office with any additional concerns. ER/RTC precautions were reviewed with the patient.   Follow-up plan: Return in about 1 year (around 08/03/2019) for annual physical, sooner as needed .     Please note: voice recognition software was used to produce this document, and typos may escape review. Please contact Dr. Sheppard Coil for any needed clarifications.

## 2018-08-02 NOTE — Patient Instructions (Addendum)
General Preventive Care  Most recent routine screening lipids/other labs:  Borderline cholesterol, work on lowering triglycerides with low fat, high fiber diet and increase exercise as tolerated   Sugar was good, no diabetes   All other labs were normal!   Everyone should have blood pressure checked once per year. Yours looks good!   Tobacco: don't! Alcohol: responsible moderation is ok for most adults - if you have concerns about your alcohol intake, please talk to me! Recreational/Illicit Drugs: don't!  Exercise: as tolerated to reduce risk of cardiovascular disease and diabetes. Strength training will also prevent osteoporosis.   Mental health: if need for mental health care (medicines, counseling, other), or concerns about moods, please let me know!   Sexual health: if need for STD testing, or if concerns with libido/pain problems, please let me know!  Vaccines  Flu vaccine: recommended for almost everyone, every fall (by Halloween! Flu is scary!)  Shingles vaccine: after age 71   Pneumonia vaccines: after age 70  Tetanus booster: Tdap recommended every 10 years - last done 05/19/2018 Cancer screenings   Colon cancer screening: recommended for everyone at age 73, but some folks need a colonoscopy sooner if risk factors   Prostate cancer screening: recommendations vary, optional PSA blood test for men around age 25 Infection screenings . HIV: recommended screening at least once age 10-65 . Gonorrhea/Chlamydia: screening as needed . Hepatitis C: recommended for anyone born 58-1966 . TB: certain at-risk populations, or depending on work requirements and/or travel history Other . Bone Density Test: recommended for men at age 58, sooner depending on risk factors . Advanced Directive: Living Will and/or Healthcare Power of Attorney recommended for all adults, regardless of age or health!

## 2018-08-03 LAB — COMPLETE METABOLIC PANEL WITH GFR
AG RATIO: 1.8 (calc) (ref 1.0–2.5)
ALKALINE PHOSPHATASE (APISO): 50 U/L (ref 40–115)
ALT: 25 U/L (ref 9–46)
AST: 15 U/L (ref 10–40)
Albumin: 4.4 g/dL (ref 3.6–5.1)
BILIRUBIN TOTAL: 0.4 mg/dL (ref 0.2–1.2)
BUN: 18 mg/dL (ref 7–25)
CO2: 25 mmol/L (ref 20–32)
Calcium: 9.7 mg/dL (ref 8.6–10.3)
Chloride: 109 mmol/L (ref 98–110)
Creat: 0.89 mg/dL (ref 0.60–1.35)
GFR, Est African American: 126 mL/min/{1.73_m2} (ref >=60)
GFR, Est Non African American: 108 mL/min/{1.73_m2} (ref >=60)
GLOBULIN: 2.5 g/dL (ref 1.9–3.7)
Glucose, Bld: 92 mg/dL (ref 65–99)
POTASSIUM: 4.4 mmol/L (ref 3.5–5.3)
Sodium: 142 mmol/L (ref 135–146)
Total Protein: 6.9 g/dL (ref 6.1–8.1)

## 2018-08-03 LAB — LIPASE: Lipase: 12 U/L (ref 7–60)

## 2018-10-31 ENCOUNTER — Telehealth: Payer: Self-pay | Admitting: Osteopathic Medicine

## 2018-10-31 NOTE — Telephone Encounter (Addendum)
Dr. Loni Muse: Nicholas Steele has an appointment with you on February 12th. He wants to be tested for Lynch Syndrome because he has family members with cancer. Family members are:        Dad cancer at  49        Aunt - cancer close to 36        Uncle - cancer at  89        Grandfather - cancer at 55, 16, 41 Thanks.

## 2018-11-20 ENCOUNTER — Ambulatory Visit (INDEPENDENT_AMBULATORY_CARE_PROVIDER_SITE_OTHER): Payer: BLUE CROSS/BLUE SHIELD | Admitting: Osteopathic Medicine

## 2018-11-20 ENCOUNTER — Encounter: Payer: Self-pay | Admitting: Osteopathic Medicine

## 2018-11-20 VITALS — BP 135/79 | HR 93 | Temp 98.3°F | Wt 245.9 lb

## 2018-11-20 DIAGNOSIS — Z800 Family history of malignant neoplasm of digestive organs: Secondary | ICD-10-CM

## 2018-11-20 NOTE — Progress Notes (Signed)
HPI: Nicholas Steele is a 38 y.o. male who  has a past medical history of Arthritis and Asthma.  he presents to [facility] today, 11/20/18,  for chief complaint of:  Lynch syndrome in family   Patient would like referral for testing, concerned he might need another colonoscopy.  Dad, colon cancer at age 47, tested positive for Nicholas Steele, recently had surgery for removal of colon cancer, at 39 years old tested positive for Lynch  2 uncles also tested positive for Lynch, one developed colon cancer at 91 years old  Grandfather and all of his brothers developed colon cancer around age 29, 75, 58  Colonoscopy was in 11/2016 sessile polyp in sigmoid colon, pathology demonstrated hyperplastic polyp without malignancy.      At today's visit 11/20/18 ... PMH, PSH, FH reviewed and updated as needed.  Current medication list and allergy/intolerance hx reviewed and updated as needed. (See remainder of HPI, ROS, Phys Exam below)          ASSESSMENT/PLAN: The encounter diagnosis was Family history of Lynch syndrome.   Orders Placed This Encounter  Procedures  . Ambulatory referral to Genetics  . Ambulatory referral to Gastroenterology     Patient Instructions  Please reach out to [name] to schedule your colonoscopy if you have not heard back from them in the next couple of days.  Their phone number is listed below.   I will get you in touch with a genetics counselor at the cancer center who can talk about the process for genetic screening.  You should be getting a call from them, their number is also listed below.        Follow-up plan: Return for recheck as needed, otherwise see me when due for annual physical  .                                                 ################################################# ################################################# ################################################# #################################################    No outpatient medications have been marked as taking for the 11/20/18 encounter (Office Visit) with Nicholas Reeve, DO.    Allergies  Allergen Reactions  . Amoxicillin Other (See Comments)       Review of Systems:  Constitutional: No recent illness  Cardiac: No  chest pain  Respiratory:  No  shortness of breath.  Gastrointestinal: No  abdominal pain   Exam:  BP 135/79 (BP Location: Left Arm, Patient Position: Sitting, Cuff Size: Normal)   Pulse 93   Temp 98.3 F (36.8 C) (Oral)   Wt 245 lb 14.4 oz (111.5 kg)   BMI 38.51 kg/m   Constitutional: VS see above. General Appearance: alert, well-developed, well-nourished, NAD  Neck: No masses, trachea midline.   Respiratory: Normal respiratory effort.   Musculoskeletal: Gait normal.   Neurological: Normal balance/coordination. No tremor.  Skin: warm, dry, intact.   Psychiatric: Normal judgment/insight. Normal mood and affect. Oriented x3.       Visit summary with medication list and pertinent instructions was printed for patient to review, patient was advised to alert [place] if any updates are needed. All questions at time of visit were answered - patient instructed to contact office with any additional concerns. ER/RTC precautions were reviewed with the patient and understanding verbalized.   Note: Total time spent 25 minutes, greater than 50% of the visit was spent face-to-face  counseling and coordinating care for the following: The encounter diagnosis was Family history of Lynch syndrome..  Please note: voice recognition software was used to produce this document, and typos may escape review. Please contact Dr.  Sheppard Steele for any needed clarifications.    Follow up plan: Return for recheck as needed, otherwise see me when due for annual physical .

## 2018-11-20 NOTE — Patient Instructions (Addendum)
Please reach out to [facility] to schedule your colonoscopy if you have not heard back from them in the next couple of days.  Their phone number is listed below.   I will get you in touch with a genetics counselor at the cancer center who can talk about the process for genetic screening.  You should be getting a call from them, their number is also listed below.

## 2018-11-22 ENCOUNTER — Telehealth: Payer: Self-pay | Admitting: Genetics

## 2018-11-22 ENCOUNTER — Encounter: Payer: Self-pay | Admitting: Genetics

## 2018-11-22 NOTE — Telephone Encounter (Signed)
A genetic counseling appt has been scheduled for the pt to see Ferol Luz on 3/30 at 4pm. Pt aware to arrive 30 minutes early. Letter mailed.

## 2018-12-10 ENCOUNTER — Ambulatory Visit (INDEPENDENT_AMBULATORY_CARE_PROVIDER_SITE_OTHER): Payer: BLUE CROSS/BLUE SHIELD | Admitting: Gastroenterology

## 2018-12-10 ENCOUNTER — Encounter: Payer: Self-pay | Admitting: Gastroenterology

## 2018-12-10 VITALS — BP 118/80 | HR 72 | Ht 67.0 in | Wt 240.0 lb

## 2018-12-10 DIAGNOSIS — Z800 Family history of malignant neoplasm of digestive organs: Secondary | ICD-10-CM

## 2018-12-10 DIAGNOSIS — K219 Gastro-esophageal reflux disease without esophagitis: Secondary | ICD-10-CM

## 2018-12-10 DIAGNOSIS — K580 Irritable bowel syndrome with diarrhea: Secondary | ICD-10-CM

## 2018-12-10 MED ORDER — DICYCLOMINE HCL 10 MG PO CAPS: 10.0000 mg | ORAL_CAPSULE | Freq: Three times a day (TID) | ORAL | 11 refills | Status: DC

## 2018-12-10 NOTE — Progress Notes (Signed)
    History of Present Illness: This is a 38 year old male with a family history of Lynch syndrome in his father, aunt and 2 uncles.  He underwent colonoscopy in February 2017 showing one small hyperplastic polyp otherwise normal.  He relates ongoing variable bowel habits with occasional days with no bowel movements however most days have 2-3 urgent bowel movements and frequently days have several episodes of watery urgent diarrhea.  This bowel pattern has not changed.  He has had intermittent heartburn which he treats with Tums.  Denies rectal bleeding, dysphagia, weight loss, abdominal pain.  Current Medications, Allergies, Past Medical History, Past Surgical History, Family History and Social History were reviewed in [employer] record.  Physical Exam: General: Well developed, well nourished, no acute distress Head: Normocephalic and atraumatic Eyes:  sclerae anicteric, EOMI Ears: Normal auditory acuity Mouth: No deformity or lesions Lungs: Clear throughout to auscultation Heart: Regular rate and rhythm; no murmurs, rubs or bruits Abdomen: Soft, mild lower abdominal tenderness and non distended. No masses, hepatosplenomegaly or hernias noted. Normal Bowel sounds Rectal: deferred to colonoscopy Musculoskeletal: Symmetrical with no gross deformities  Pulses:  Normal pulses noted Extremities: No clubbing, cyanosis, edema or deformities noted Neurological: Alert oriented x 4, grossly nonfocal Psychological:  Alert and cooperative. Normal mood and affect   Assessment and Recommendations:  1. Family history of Lynch syndrome.  He is scheduled for genetic testing later this month.  If Donnal Debar is diagnosed I recommended colonoscopy and EGD and he states he will schedule shortly after his results return.  Colonoscopies would be annually if he has Lynch syndrome.  If he does not have Lynch syndrome I have recommended colonoscopies every 3 to 5 years and he will consider the  timing. The risks (including bleeding, perforation, infection, missed lesions, medication reactions and possible hospitalization or surgery if complications occur), benefits, and alternatives to colonoscopy with possible biopsy and possible polypectomy were discussed with the patient and they consent to proceed. The risks (including bleeding, perforation, infection, missed lesions, medication reactions and possible hospitalization or surgery if complications occur), benefits, and alternatives to endoscopy with possible biopsy and possible dilation were discussed with the patient and they consent to proceed.   2. GERD, mild.  Follow standard antireflux measures.  Begin omeprazole OTC if symptoms become more frequent or more severe.  Consider EGD for further evaluation.  3. 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  Begin dicyclomine 10 mg before meals and at bedtime. Miralax qd prn for infrequent days with no bowel movements.

## 2018-12-10 NOTE — Patient Instructions (Signed)
We have sent the following medications to your pharmacy for you to pick up at your convenience: dicyclomine.   Patient advised to avoid spicy, acidic, citrus, chocolate, mints, fruit and fruit juices.  Limit the intake of caffeine, alcohol and Soda.  Don't exercise too soon after eating.  Don't lie down within 3-4 hours of eating.  Elevate the head of your bed.  It has been recommended to you by your physician that you have a(n) Upper Endoscopy/Colonoscopy completed. Per your request, we did not schedule the procedure(s) today. Please contact our office at [phone] should you decide to have the procedure completed.  Thank you for choosing me and [facility].  Pricilla Riffle. Dagoberto Ligas., MD., Marval Regal

## 2018-12-25 ENCOUNTER — Telehealth: Payer: Self-pay | Admitting: Licensed Clinical Social Worker

## 2018-12-25 NOTE — Telephone Encounter (Signed)
Rescheduled genetics appointment for 03/10/2019 at 1 pm.

## 2019-01-06 ENCOUNTER — Encounter: Payer: BLUE CROSS/BLUE SHIELD | Admitting: Licensed Clinical Social Worker

## 2019-03-05 ENCOUNTER — Telehealth: Payer: Self-pay | Admitting: Licensed Clinical Social Worker

## 2019-03-05 NOTE — Telephone Encounter (Signed)
Called patient regarding upcoming [facility] appointment, per patient's request this will be a walk in visit. Labs will be kept.  Message to [place].

## 2019-03-10 ENCOUNTER — Inpatient Hospital Stay: Payer: BLUE CROSS/BLUE SHIELD | Attending: Osteopathic Medicine | Admitting: Licensed Clinical Social Worker

## 2019-03-10 ENCOUNTER — Other Ambulatory Visit: Payer: Self-pay

## 2019-03-10 ENCOUNTER — Inpatient Hospital Stay: Payer: BLUE CROSS/BLUE SHIELD

## 2019-03-10 ENCOUNTER — Encounter: Payer: Self-pay | Admitting: Licensed Clinical Social Worker

## 2019-03-10 DIAGNOSIS — Z800 Family history of malignant neoplasm of digestive organs: Secondary | ICD-10-CM | POA: Diagnosis not present

## 2019-03-10 DIAGNOSIS — Z801 Family history of malignant neoplasm of trachea, bronchus and lung: Secondary | ICD-10-CM | POA: Diagnosis not present

## 2019-03-10 NOTE — Progress Notes (Signed)
REFERRING PROVIDER: Emeterio Reeve, DO [address], [place] [address]  PRIMARY PROVIDER:  Emeterio Reeve, DO  PRIMARY REASON FOR VISIT:  1. Family history of Lynch syndrome   2. Family history of colon cancer   3. Family history of pancreatic cancer   4. Family history of lung cancer     HISTORY OF PRESENT ILLNESS:   Nicholas Steele, a 38 y.o. male, was seen for a [place] cancer genetics consultation at the request of Dr. Sheppard Coil due to a family history of Lynch syndrome.  Nicholas Steele presents to clinic today to discuss the possibility of a hereditary predisposition to cancer, genetic testing, and to further clarify his future cancer risks, as well as potential cancer risks for family members.    Nicholas Steele is a 38 y.o. male with no personal history of cancer.  He had a colonoscopy in 2017 that revealed one polyp. An upper endoscopy has been recommended by his GI provider, Dr. Fuller Plan. The patient reports no smoking, social drinking, and no major exposures to radiation.  Past Medical History:  Diagnosis Date  . Arthritis   . Asthma   . Family history of colon cancer   . Family history of lung cancer   . Family history of Lynch syndrome   . Family history of pancreatic cancer     Past Surgical History:  Procedure Laterality Date  . HERNIA REPAIR      Social History   Socioeconomic History  . Marital status: Single    Spouse name: Not on file  . Number of children: 0  . Years of education: Not on file  . Highest education level: Not on file  Occupational History  . Not on file  Social Needs  . Financial resource strain: Not on file  . Food insecurity:    Worry: Not on file    Inability: Not on file  . Transportation needs:    Medical: Not on file    Non-medical: Not on file  Tobacco Use  . Smoking status: Never Smoker  . Smokeless tobacco: Never Used  Substance and Sexual Activity  . Alcohol use: Yes    Comment: Socially  . Drug  use: Yes    Types: Marijuana    Comment: In 20's  . Sexual activity: Not Currently  Lifestyle  . Physical activity:    Days per week: Not on file    Minutes per session: Not on file  . Stress: Not on file  Relationships  . Social connections:    Talks on phone: Not on file    Gets together: Not on file    Attends religious service: Not on file    Active member of club or organization: Not on file    Attends meetings of clubs or organizations: Not on file    Relationship status: Not on file  Other Topics Concern  . Not on file  Social History Narrative  . Not on file     FAMILY HISTORY:  We obtained a detailed, 4-generation family history.  Significant diagnoses are listed below: Family History  Problem Relation Age of Onset  . Hyperlipidemia Father   . Hypertension Father   . Stroke Father   . Colon cancer Father 32       Lynch +  . Depression Maternal Aunt   . Cancer Maternal Aunt        unk type  . Alcohol abuse Paternal Uncle   . Colon cancer Paternal  Uncle        Lynch+  . Diabetes Paternal Uncle   . Diabetes Maternal Grandfather   . Lung cancer Maternal Grandfather   . Colon cancer Paternal Grandfather   . Colon cancer Paternal Aunt        Lynch +  . Colon cancer Paternal Aunt        Lynch +  . Pancreatic cancer Paternal Aunt    Nicholas Steele does not have children. He has one brother, 42, who reportedly tested positive for Lynch syndrome. The patient did not bring a copy of any family member's reports today but will send one to me via email. The patient also has one paternal half sister who is 36.   Nicholas Steele mother has had skin cancer, she is living at 51. The patient has 5 maternal aunts, one has had cancer but he is unsure of the type. He has limited information about maternal cousins, but there are no cancers he is aware of. His maternal grandfather had lung cancer and a history of smoking. Maternal grandmother is living at 84.  Nicholas Steele father was  diagnosed with colon cancer at 31, living at 1, and tested positive for Lynch syndrome. The patient has 4 paternal uncles, 2 paternal aunts. One of his aunts had colon cancer and tested positive for Lynch, she is living. Another aunt had colon and pancreatic cancer and has passed away. An uncle had colon cancer at 28 and tested positive for Lynch syndrome. No known cancers in paternal cousins. Patient's paternal grandmother is living at 54. Paternal grandfather had colon cancer 3 times and has passed away. The patient believes almost all of his grandfather's 8 brothers and sisters had colon cancer as well.   Nicholas Steele is aware of previous family history of genetic testing for hereditary cancer risks. Patient's maternal ancestors are of European/German descent, and paternal ancestors are of European descent. There is no reported Ashkenazi Jewish ancestry. There is no known consanguinity.  GENETIC COUNSELING ASSESSMENT: Nicholas Steele is a 38 y.o. male with a family history of Lynch syndrome. We, therefore, discussed and recommended the following at today's visit.   DISCUSSION: We discussed that 5-10% of colon cancer is hereditary, with most cases associated with Lynch syndrome. We discussed Lynch syndrome in detail.  Lynch Syndrome is also called HNPCC (Hereditary Non-Polyposis Colon Cancer).  This syndrome increases the risk for colon, uterine, ovarian and stomach cancers, brain cancers, as well as others.  Families with Lynch Syndrome tend to have multiple family members with these cancers, typically diagnosed under age 69, and diagnoses in multiple generations. The genes that are known to cause Lynch Syndrome are called MLH1, MSH2, MSH6, PMS2 and EPCAM.    We reviewed the characteristics, features and inheritance patterns of hereditary cancer syndromes. We also discussed genetic testing, including the appropriate family members to test, the process of testing, insurance coverage and turn-around-time for  results. We discussed the implications of a negative, positive and/or variant of uncertain significant result. We recommended Nicholas Steele pursue genetic testing for the [facility] Hereditary Cancers gene panel.   The Common Hereditary Cancers Panel offered by [facility] includes sequencing and/or deletion duplication testing of the following 48 genes: APC, ATM, AXIN2, BARD1, BMPR1A, BRCA1, BRCA2, BRIP1, CDH1, CDKN2A (p14ARF), CDKN2A (p16INK4a), CKD4, CHEK2, CTNNA1, DICER1, EPCAM (Deletion/duplication testing only), GREM1 (promoter region deletion/duplication testing only), KIT, MEN1, MLH1, MSH2, MSH3, MSH6, MUTYH, NBN, NF1, NHTL1, PALB2, PDGFRA, PMS2, POLD1, POLE, PTEN, RAD50, RAD51C, RAD51D, RNF43, SDHB,  SDHC, SDHD, SMAD4, SMARCA4. STK11, TP53, TSC1, TSC2, and VHL.  The following genes were evaluated for sequence changes only: SDHA and HOXB13 c.251G>A variant only.  Based on Mr. Traywick's family history of cancer, he meets medical criteria for genetic testing. Despite that he meets criteria, he may still have an out of pocket cost. The lab will notify him of an OOP if any.     PLAN: After considering the risks, benefits, and limitations, Nicholas Steele provided informed consent to pursue genetic testing and the blood sample was sent to [facility] for analysis of the Common Hereditary Cancers Panel. Results should be available within approximately 2-3 weeks' time, at which point they will be disclosed by telephone to Nicholas Steele, as will any additional recommendations warranted by these results. Nicholas Steele will receive a summary of his genetic counseling visit and a copy of his results once available. This information will also be available in Epic.   Based on Nicholas Steele's family history, we recommended his paternal relatives who have not yet had testing have genetic counseling and testing. Nicholas Steele will let us know if we can be of any assistance in coordinating genetic counseling and/or testing for  this family member.   Lastly, we encouraged Nicholas Steele to remain in contact with cancer genetics annually so that we can continuously update the family history and inform him of any changes in cancer genetics and testing that may be of benefit for this family.   Nicholas Steele questions were answered to his satisfaction today. Our contact information was provided should additional questions or concerns arise. Thank you for the referral and allowing [place] to share in the care of your patient.   Faith Rogue, MS Genetic Counselor [email or web address] Phone: [phone]  The patient was seen for a total of 25 minutes in face-to-face genetic counseling.  Drs. Magrinat, Lindi Adie and/or Burr Medico were available for discussion regarding this case.

## 2019-03-28 ENCOUNTER — Encounter: Payer: Self-pay | Admitting: Licensed Clinical Social Worker

## 2019-03-28 ENCOUNTER — Telehealth: Payer: Self-pay

## 2019-03-28 ENCOUNTER — Ambulatory Visit: Payer: Self-pay | Admitting: Licensed Clinical Social Worker

## 2019-03-28 DIAGNOSIS — Z801 Family history of malignant neoplasm of trachea, bronchus and lung: Secondary | ICD-10-CM

## 2019-03-28 DIAGNOSIS — Z1509 Genetic susceptibility to other malignant neoplasm: Secondary | ICD-10-CM

## 2019-03-28 DIAGNOSIS — Z1379 Encounter for other screening for genetic and chromosomal anomalies: Secondary | ICD-10-CM

## 2019-03-28 DIAGNOSIS — Z800 Family history of malignant neoplasm of digestive organs: Secondary | ICD-10-CM

## 2019-03-28 DIAGNOSIS — Z15068 Genetic susceptibility to other malignant neoplasm of digestive system: Secondary | ICD-10-CM

## 2019-03-28 HISTORY — DX: Genetic susceptibility to other malignant neoplasm: Z15.09

## 2019-03-28 HISTORY — DX: Genetic susceptibility to other malignant neoplasm of digestive system: Z15.068

## 2019-03-28 NOTE — Progress Notes (Signed)
GENETIC TEST RESULTS   Patient Name: Nicholas Steele Patient Age: 38 y.o. Encounter Date: 03/28/2019  Referring Provider: Emeterio Reeve, DO  HPI: Nicholas Steele was previously seen in the [place] clinic due to a family of Lynch syndrome and concerns regarding a hereditary predisposition to cancer. Please refer to our prior cancer genetics clinic note for more information regarding Nicholas Steele's medical, social and family histories, and our assessment and recommendations, at the time. Nicholas Steele recent genetic test results were disclosed to her, as were recommendations warranted by these results. These results and recommendations are discussed in more detail below.   FAMILY HISTORY:  We obtained a detailed, 4-generation family history.  Significant diagnoses are listed below: Family History  Problem Relation Age of Onset  . Hyperlipidemia Father   . Hypertension Father   . Stroke Father   . Colon cancer Father 49       Lynch +  . Depression Maternal Aunt   . Cancer Maternal Aunt        unk type  . Alcohol abuse Paternal Uncle   . Colon cancer Paternal Uncle        Lynch+  . Diabetes Paternal Uncle   . Diabetes Maternal Grandfather   . Lung cancer Maternal Grandfather   . Colon cancer Paternal Grandfather   . Colon cancer Paternal Aunt        Lynch +  . Colon cancer Paternal Aunt        Lynch +  . Pancreatic cancer Paternal Aunt    Nicholas Steele does not have children. He has one brother, 42, who reportedly tested positive for Lynch syndrome. The patient did not bring a copy of any family member's reports today but will send one to me via email. The patient also has one paternal half sister who is 41.   Nicholas Steele mother has had skin cancer, she is living at 55. The patient has 5 maternal aunts, one has had cancer but he is unsure of the type. He has limited information about maternal cousins, but there are no cancers he is aware of. His maternal  grandfather had lung cancer and a history of smoking. Maternal grandmother is living at 32.  Nicholas Steele father was diagnosed with colon cancer at 60, living at 25, and tested positive for Lynch syndrome. The patient has 4 paternal uncles, 2 paternal aunts. One of his aunts had colon cancer and tested positive for Lynch, she is living. Another aunt had colon and pancreatic cancer and has passed away. An uncle had colon cancer at 86 and tested positive for Lynch syndrome. No known cancers in paternal cousins. Patient's paternal grandmother is living at 90. Paternal grandfather had colon cancer 3 times and has passed away. The patient believes almost all of his grandfather's 8 brothers and sisters had colon cancer as well.   Nicholas Steele is aware of previous family history of genetic testing for hereditary cancer risks. Patient's maternal ancestors are of European/German descent, and paternal ancestors are of European descent. There is no reported Ashkenazi Jewish ancestry. There is no known consanguinity.  GENETIC TESTING:  At the time of Nicholas Steele's visit, we recommended he pursue genetic testing of the [facility] Common Hereditary Cancer Panel since he did not have a copy of his family's reports. The genetic testing reported out on 03/21/2019 through the Common Hereditary Cancer Panel offered by [facility] identified a single, heterozygous likely pathogenic gene mutation called MLH1 c.292G>A  confirming the  diagnosis of Lynch syndrome.  There were no deleterious mutations in the remainder of the panel. The Common Hereditary Cancers Panel offered by [facility] includes sequencing and/or deletion duplication testing of the following 48 genes: APC, ATM, AXIN2, BARD1, BMPR1A, BRCA1, BRCA2, BRIP1, CDH1, CDKN2A (p14ARF), CDKN2A (p16INK4a), CKD4, CHEK2, CTNNA1, DICER1, EPCAM (Deletion/duplication testing only), GREM1 (promoter region deletion/duplication testing only), KIT, MEN1, MLH1, MSH2, MSH3, MSH6, MUTYH, NBN,  NF1, NHTL1, PALB2, PDGFRA, PMS2, POLD1, POLE, PTEN, RAD50, RAD51C, RAD51D, RNF43, SDHB, SDHC, SDHD, SMAD4, SMARCA4. STK11, TP53, TSC1, TSC2, and VHL.  The following genes were evaluated for sequence changes only: SDHA and HOXB13 c.251G>A variant only.  A copy of the test report has been scanned into Epic for review.   CLINICAL CONDITION Lynch syndrome is characterized by an increased risk of colorectal cancer as well as cancers of the endometrium, ovary, prostate, stomach, small intestine, hepatobiliary tract, urinary tract, pancreas and brain (PMID: 8088110, 31594585, 92924462). Lynch syndrome tumors typically demonstrate microsatellite instability (MSI) as well as loss of expression of the mismatch repair proteins on immunohistochemical (IHC) staining. This condition is caused by pathogenic variants in one of the mismatch repair genes-MLH1, MSH2, MSH6, PMS2-as well as deletions in the 3' end of the EPCAM gene.  RISKS    MANAGEMENT: Surveillance guidelines, as published by the [employer]  (NCCN), suggest the following for individuals with Lynch syndrome (*NCCN. Genetic/Familial High-Risk Assessment: Colorectal. Version 2.2019):   Colon cancer:              - Colonoscopy every 1-2 years starting at 48-72 years of age, or 2-5 years prior to the earliest colon cancer if diagnosed before age 23.   Gynecological cancer:             Uterine: There is no clear evidence to support routine endometrial cancer screening, nor do the data support ovarian cancer screening; however annual office endometrial sampling is an option.  Education regarding prompt evaluation for dysfunctional uterine bleeding.  Prophylactic hysterectomy can be considered.   Ovarian: Insufficient evidence exists to make any specific recommendations for Prophylactic bilateral salpingo-oophorectomy (BSO) for patients with mutations in MSH6 and PMS2.  This may be considered by women after childbearing is  complete. Serum CA-125 for ovarian cancer and transvaginal ultrasound for ovarian and endometrial cancer may be considered at the clinician's discretion. These modalities have not been shown to be sufficiently sensitive or specific to support a positive recommendation.   Other extra-colonic cancers - While there is no clear evidence to support screening for gastric, duodenal, and small bowel cancer, select individuals, families  may consider esophagogastroduodenoscopy (EGD) with extended duodenoscopy every 3-5 years beginning at age 12-2 years of age. - Consider testing for and treating H. Pylori. - Consider annual urinalysis beginning at 18-51 years of age to screen for urothelial cancer. - Consider annual physical/neurologic examination starting at 13-61 years of age.  Current data suggests that individuals with Lynch Syndrome mutations may be at an increased risk for prostate and breast cancer.  However at this time, insufficient evidence exists to recommend increased screening above the average population recommendations.   NCCN states that no screening recommendation can be made for pancreatic cancer at this time (*NCCN. Genetic/Familial High-Risk Assessment: Colorectal. Version 2. 2019). In contrast, the [employer] of Gastroenterology Clinical Guidelines recommend pancreatic cancer screening for individuals with Lynch syndrome with a first- or second-degree relative affected with pancreatic cancer (PMID: 86381771, 16579038). Ideally, screening should be performed in experienced centers utilizing a  multidisciplinary approach under research conditions. Recommended screening includes annual endoscopic ultrasound and/or MRI of the pancreas starting at age 37 or 40 years younger than the earliest age of pancreatic cancer diagnosis in the family (PMID: 04599774).  Nicholas Steele does report family history of pancreatic cancer and therefore consider pancreatic screening.   FAMILY MEMBERS:  Lynch  Syndrome has Autosomal Dominant Inheritance.  Since we now know the mutation in Nicholas Steele, we can test at-risk relatives to determine whether or not they have inherited the mutation and are at increased risk for cancer.  It is important that all of Nicholas Steele relatives (both men and women) know of the presence of this gene mutation. Site-specific genetic testing can sort out who in the family is at risk and who is not. We will be happy to meet with any of the family members or refer them to a genetic counselor in their local area. To locate genetic counselors in other cities, individuals can visit the website of the [employer] (ArtistMovie.se) and Secretary/administrator for a Social worker by zip code.   Children who inherit two mutations in the same Lynch gene, one mutation from each parent, are at-risk of a rare recessive condition called constitutional mismatch repair deficiency (CMMR-D) syndrome. If family members have this mutation, they may wish to have their partner tested if they are planning on having children.  PLAN: Nicholas Steele will need to be followed as high risk based on his diagnosis of Lynch syndrome.     Nicholas Steele has identified a gastroenterologist that he plans to see for follow up for his diagnosis of Lynch syndrome. This individual is Dr. Fuller Plan at [facility]. This information will also be made available to his PCP, Emeterio Reeve, DO.   Nicholas Steele will also share this information with his family members. Many of his family members have already been tested for Lynch syndrome. I made him aware that, based on his testing, other family members not yet tested have the opportunity to be tested for free through [facility]'s familial variant testing.   RESOURCES:  Nicholas Steele was given patient information about Lynch syndrome that was written by the national support group "it takes guts". He was also provided information about the cancer registry program, Vivia Ewing, out of [employer].  The cancer registry provides a way for researchers and patients to link together.  [facility] also provides a biannual newsletter that provides updated information about hereditary cancer genes and syndromes.  We strongly encouraged Nicholas Steele to remain in contact with [place] in cancer genetics on an annual basis so we can update Nicholas Steele's personal and family histories, and inform him of advances in cancer genetics that may be of benefit for the entire family. Mr. Koskinen knows he is also welcome to call with any questions or concerns, at any time.   Faith Rogue, MS Genetic Counselor [email or web address] Phone: [phone]

## 2019-03-28 NOTE — Telephone Encounter (Signed)
-----  Message from Ladene Artist, MD sent at 03/28/2019  3:55 PM EDT -----   ----- Message ----- From: Eyvonne Mechanic Sent: 03/28/2019   2:44 PM EDT To: Ladene Artist, MD, Emeterio Reeve, DO  Positive for MLH1 Lynch syndrome

## 2019-03-28 NOTE — Progress Notes (Signed)
See March office note. Based on his established diagnosis of Lynch syndrome colonoscopy and EGD need to be scheduled now.

## 2019-03-28 NOTE — Telephone Encounter (Signed)
Patient has been scheduled for endo/colon for 7/20 and pre-visit on 04/14/19

## 2019-04-14 ENCOUNTER — Ambulatory Visit (AMBULATORY_SURGERY_CENTER): Payer: BC Managed Care – PPO | Admitting: *Deleted

## 2019-04-14 ENCOUNTER — Other Ambulatory Visit: Payer: Self-pay

## 2019-04-14 ENCOUNTER — Telehealth: Payer: Self-pay | Admitting: *Deleted

## 2019-04-14 VITALS — Ht 67.0 in | Wt 240.0 lb

## 2019-04-14 DIAGNOSIS — Z1509 Genetic susceptibility to other malignant neoplasm: Secondary | ICD-10-CM

## 2019-04-14 MED ORDER — SUPREP BOWEL PREP KIT 17.5-3.13-1.6 GM/177ML PO SOLN: 1.0000 | Freq: Once | ORAL | 0 refills | Status: AC

## 2019-04-14 NOTE — Progress Notes (Signed)
No egg or soy allergy known to patient  issues with past sedation with any surgeries  or procedures- Pt states hard to wake, drowsy long periods of time , no past  intubation No diet pills per patient No home 02 use per patient  No blood thinners per patient  Pt denies issues with constipation - pt has diarrhea issues  No A fib or A flutter  EMMI video sent to pt's e mail   TE to Fuller Plan about anti nausea meds- pt sates vomited suprep last colon   Pt verified name, DOB, address and insurance during PV today. Pt mailed instruction packet to included paper to complete and mail back to [facility] with addressed and stamped envelope, Emmi video, copy of consent form to read and not return, and instructions. Suprep $15  coupon mailed in packet. PV completed over the phone. Pt encouraged to call with questions or issues   Pt is aware that care partner will wait in the car during proceudre; if they feel like they will be too hot to wait in the car; they may wait in the lobby.  We want them to wear a mask (we do not have any that we can provide them), practice social distancing, and we will check their temperatures when they get here.  I did remind patient that their care partner needs to stay in the parking lot the entire time. Pt will wear mask into building.

## 2019-04-14 NOTE — Telephone Encounter (Signed)
Dr Fuller Plan,  I did a PV on Mr Nicholas Steele this afternoon. He has an egd/colon scheduled for 7-20 with you .  He informed me at his last colon 2018 , he had lots of vomiting with the SUprep.  Can he have medicine to help prevent that this colon?  Please advise, thanks for your time, Lelan Pons

## 2019-04-15 MED ORDER — ONDANSETRON HCL 4 MG PO TABS: 4.0000 mg | ORAL_TABLET | ORAL | 0 refills | Status: DC

## 2019-04-15 MED ORDER — ONDANSETRON HCL 4 MG PO TABS: 4.0000 mg | ORAL_TABLET | Freq: Three times a day (TID) | ORAL | 1 refills | Status: DC | PRN

## 2019-04-15 NOTE — Telephone Encounter (Signed)
Zofran 4 mg po 30 minutes before each dose of bowel prep. Can use Miralax/Gatorade prep if patient prefers.

## 2019-04-15 NOTE — Telephone Encounter (Signed)
Pt informed I will call in the Zofran- sent to pharmacy -  and He does want to try Miralx/ Gatorade vs Suprep- New instructions to Pt - pt aware  Mailed new instructions - told pt to call with questions once he receives these new instructions- he verbalized understanding

## 2019-04-25 ENCOUNTER — Telehealth: Payer: Self-pay | Admitting: Gastroenterology

## 2019-04-25 NOTE — Telephone Encounter (Signed)

## 2019-04-28 ENCOUNTER — Ambulatory Visit (AMBULATORY_SURGERY_CENTER): Payer: BC Managed Care – PPO | Admitting: Gastroenterology

## 2019-04-28 ENCOUNTER — Encounter: Payer: Self-pay | Admitting: Gastroenterology

## 2019-04-28 ENCOUNTER — Other Ambulatory Visit: Payer: Self-pay

## 2019-04-28 VITALS — BP 116/72 | HR 78 | Temp 99.0°F | Resp 11 | Ht 67.0 in | Wt 240.0 lb

## 2019-04-28 DIAGNOSIS — D123 Benign neoplasm of transverse colon: Secondary | ICD-10-CM | POA: Diagnosis not present

## 2019-04-28 DIAGNOSIS — D124 Benign neoplasm of descending colon: Secondary | ICD-10-CM | POA: Diagnosis not present

## 2019-04-28 DIAGNOSIS — Z1211 Encounter for screening for malignant neoplasm of colon: Secondary | ICD-10-CM

## 2019-04-28 DIAGNOSIS — Z1509 Genetic susceptibility to other malignant neoplasm: Secondary | ICD-10-CM | POA: Diagnosis not present

## 2019-04-28 DIAGNOSIS — K219 Gastro-esophageal reflux disease without esophagitis: Secondary | ICD-10-CM | POA: Diagnosis not present

## 2019-04-28 MED ORDER — SODIUM CHLORIDE 0.9 % IV SOLN: 500.0000 mL | Freq: Once | INTRAVENOUS | Status: DC

## 2019-04-28 NOTE — Progress Notes (Signed)
Called to room to assist during endoscopic procedure.  Patient ID and intended procedure confirmed with present staff. Received instructions for my participation in the procedure from the performing physician.  

## 2019-04-28 NOTE — Progress Notes (Signed)
PT taken to PACU. Monitors in place. VSS. Report given to RN. 

## 2019-04-28 NOTE — Progress Notes (Signed)
No problems noted in the recovery room. maw 

## 2019-04-28 NOTE — Patient Instructions (Signed)
YOU HAD AN ENDOSCOPIC PROCEDURE TODAY AT THE [facility]:   Refer to the procedure report that was given to you for any specific questions about what was found during the examination.  If the procedure report does not answer your questions, please call your gastroenterologist to clarify.  If you requested that your care partner not be given the details of your procedure findings, then the procedure report has been included in a sealed envelope for you to review at your convenience later.  YOU SHOULD EXPECT: Some feelings of bloating in the abdomen. Passage of more gas than usual.  Walking can help get rid of the air that was put into your GI tract during the procedure and reduce the bloating. If you had a lower endoscopy (such as a colonoscopy or flexible sigmoidoscopy) you may notice spotting of blood in your stool or on the toilet paper. If you underwent a bowel prep for your procedure, you may not have a normal bowel movement for a few days.  Please Note:  You might notice some irritation and congestion in your nose or some drainage.  This is from the oxygen used during your procedure.  There is no need for concern and it should clear up in a day or so.  SYMPTOMS TO REPORT IMMEDIATELY:   Following lower endoscopy (colonoscopy or flexible sigmoidoscopy):  Excessive amounts of blood in the stool  Significant tenderness or worsening of abdominal pains  Swelling of the abdomen that is new, acute  Fever of 100F or higher   Following upper endoscopy (EGD)  Vomiting of blood or coffee ground material  New chest pain or pain under the shoulder blades  Painful or persistently difficult swallowing  New shortness of breath  Fever of 100F or higher  Black, tarry-looking stools  For urgent or emergent issues, a gastroenterologist can be reached at any hour by calling [phone].   DIET:  We do recommend a small meal at first, but then you may proceed to your regular diet.  Drink  plenty of fluids but you should avoid alcoholic beverages for 24 hours.  ACTIVITY:  You should plan to take it easy for the rest of today and you should NOT DRIVE or use heavy machinery until tomorrow (because of the sedation medicines used during the test).    FOLLOW UP: Our staff will call the number listed on your records 48-72 hours following your procedure to check on you and address any questions or concerns that you may have regarding the information given to you following your procedure. If we do not reach you, we will leave a message.  We will attempt to reach you two times.  During this call, we will ask if you have developed any symptoms of COVID 19. If you develop any symptoms (ie: fever, flu-like symptoms, shortness of breath, cough etc.) before then, please call [phone].  If you test positive for Covid 19 in the 2 weeks post procedure, please call and report this information to us.    If any biopsies were taken you will be contacted by phone or by letter within the next 1-3 weeks.  Please call us at [phone] if you have not heard about the biopsies in 3 weeks.    SIGNATURES/CONFIDENTIALITY: You and/or your care partner have signed paperwork which will be entered into your electronic medical record.  These signatures attest to the fact that that the information above on your After Visit Summary has been reviewed and is   understood.  Full responsibility of the confidentiality of this discharge information lies with you and/or your care-partner.    Handouts were given to you  on polyps and hemorrhoids. NO ASPIRIN, ASPIRIN CONTAINING PRODUCTS (BC OR GOODY POWDERS) OR NSAIDS (IBUPROFEN, ADVIL, ALEVE, AND MOTRIN) FOR 2 weeks; TYLENOL IS OK TO TAKE You may resume your other current medications today. Await biopsy results. Repeat colonoscopy in 1 year for surveillance. Repeat upper endoscopy in 3 years for screening purposes. Please call if any questions or concerns.

## 2019-04-28 NOTE — Op Note (Signed)
[place] Patient Name: Nicholas Steele Procedure Date: 04/28/2019 2:49 PM MRN: [id number] Endoscopist: Ladene Artist , MD Age: 38 Referring MD:  Date of Birth: 08-26-1981 Gender: Male Account #: [id number] Procedure:                Colonoscopy Indications:              Colon cancer screening in patient at increased                            risk: Family history of hereditary nonpolyposis                            colorectal cancer in 1st-degree relative Medicines:                Monitored Anesthesia Care Procedure:                Pre-Anesthesia Assessment:                           - Prior to the procedure, a History and Physical                            was performed, and patient medications and                            allergies were reviewed. The patient's tolerance of                            previous anesthesia was also reviewed. The risks                            and benefits of the procedure and the sedation                            options and risks were discussed with the patient.                            All questions were answered, and informed consent                            was obtained. Prior Anticoagulants: The patient has                            taken no previous anticoagulant or antiplatelet                            agents. ASA Grade Assessment: II - A patient with                            mild systemic disease. After reviewing the risks                            and benefits, the patient was deemed in  satisfactory condition to undergo the procedure.                           After obtaining informed consent, the colonoscope                            was passed under direct vision. Throughout the                            procedure, the patient's blood pressure, pulse, and                            oxygen saturations were monitored continuously. The                            Colonoscope was  introduced through the anus and                            advanced to the the cecum, identified by                            appendiceal orifice and ileocecal valve. The                            ileocecal valve, appendiceal orifice, and rectum                            were photographed. The quality of the bowel                            preparation was good. The colonoscopy was performed                            without difficulty. The patient tolerated the                            procedure well. Scope In: 2:59:36 PM Scope Out: 3:17:28 PM Scope Withdrawal Time: 0 hours 14 minutes 45 seconds  Total Procedure Duration: 0 hours 17 minutes 52 seconds  Findings:                 The perianal and digital rectal examinations were                            normal.                           A 10 mm polyp was found in the transverse colon.                            The polyp was sessile. The polyp was removed with a                            hot snare. Resection and retrieval were complete.  A 6 mm polyp was found in the descending colon. The                            polyp was sessile. The polyp was removed with a                            cold snare. Resection and retrieval were complete.                           Internal hemorrhoids were found during                            retroflexion. The hemorrhoids were small and Grade                            I (internal hemorrhoids that do not prolapse).                           The exam was otherwise without abnormality on                            direct and retroflexion views. Complications:            No immediate complications. Estimated blood loss:                            None. Estimated Blood Loss:     Estimated blood loss: none. Impression:               - One 10 mm polyp in the transverse colon, removed                            with a hot snare. Resected and retrieved.                            - One 6 mm polyp in the descending colon, removed                            with a cold snare. Resected and retrieved.                           - Internal hemorrhoids.                           - The examination was otherwise normal on direct                            and retroflexion views. Recommendation:           - Repeat colonoscopy in 1 year for surveillance.                           - Patient has a contact number available for  emergencies. The signs and symptoms of potential                            delayed complications were discussed with the                            patient. Return to normal activities tomorrow.                            Written discharge instructions were provided to the                            patient.                           - Resume previous diet.                           - Continue present medications.                           - Await pathology results.                           - No aspirin, ibuprofen, naproxen, or other                            non-steroidal anti-inflammatory drugs for 2 weeks                            after polyp removal. Ladene Artist, MD 04/28/2019 3:28:17 PM This report has been signed electronically.

## 2019-04-28 NOTE — Progress Notes (Signed)
Temperature taken by Fayrene Fearing, CMA, VS taken by Rica Mote, CMA

## 2019-04-28 NOTE — Op Note (Signed)
[place] Patient Name: Nicholas Steele Procedure Date: 04/28/2019 2:48 PM MRN: [id number] Endoscopist: Ladene Artist , MD Age: 38 Referring MD:  Date of Birth: [birth year] Gender: Male Account #: [id number] Procedure:                Upper GI endoscopy Indications:              Screening procedure for history of Lynch syndrome,                            Gastroesophageal reflux disease Medicines:                Monitored Anesthesia Care Procedure:                Pre-Anesthesia Assessment:                           - Prior to the procedure, a History and Physical                            was performed, and patient medications and                            allergies were reviewed. The patient's tolerance of                            previous anesthesia was also reviewed. The risks                            and benefits of the procedure and the sedation                            options and risks were discussed with the patient.                            All questions were answered, and informed consent                            was obtained. Prior Anticoagulants: The patient has                            taken no previous anticoagulant or antiplatelet                            agents. ASA Grade Assessment: II - A patient with                            mild systemic disease. After reviewing the risks                            and benefits, the patient was deemed in                            satisfactory condition to undergo the procedure.  After obtaining informed consent, the endoscope was                            passed under direct vision. Throughout the                            procedure, the patient's blood pressure, pulse, and                            oxygen saturations were monitored continuously. The                            Endoscope was introduced through the mouth, and                            advanced to the third  part of duodenum. The upper                            GI endoscopy was accomplished without difficulty.                            The patient tolerated the procedure well. Scope In: Scope Out: Findings:                 The esophagus was normal.                           The stomach was normal.                           The examined duodenum was normal.                           The cardia and gastric fundus were normal on                            retroflexion. Complications:            No immediate complications. Estimated Blood Loss:     Estimated blood loss: none. Impression:               - Normal esophagus.                           - Normal stomach.                           - Normal examined duodenum.                           - No specimens collected. Recommendation:           - Patient has a contact number available for                            emergencies. The signs and symptoms of potential  delayed complications were discussed with the                            patient. Return to normal activities tomorrow.                            Written discharge instructions were provided to the                            patient.                           - Resume previous diet.                           - Continue present medications.                           - Patient has a contact number available for                            emergencies. The signs and symptoms of potential                            delayed complications were discussed with the                            patient. Return to normal activities tomorrow.                            Written discharge instructions were provided to the                            patient.                           - Resume previous diet.                           - Continue present medications.                           - Repeat upper endoscopy in 3 years for screening                             purposes. Ladene Artist, MD 04/28/2019 3:31:45 PM This report has been signed electronically.

## 2019-04-30 ENCOUNTER — Telehealth: Payer: Self-pay

## 2019-04-30 NOTE — Telephone Encounter (Signed)
  Follow up Call-  Call back number 04/28/2019 11/20/2016  Post procedure Call Back phone  # [phone]  Permission to leave phone message Yes Yes  Some recent data might be hidden     Patient questions:  Do you have a fever, pain , or abdominal swelling? No. Pain Score  0 *  Have you tolerated food without any problems? Yes.    Have you been able to return to your normal activities? Yes.    Do you have any questions about your discharge instructions: Diet   No. Medications  No. Follow up visit  No.  Do you have questions or concerns about your Care? No.  Actions: * If pain score is 4 or above: 1. No action needed, pain <4.Have you developed a fever since your procedure? no  2.   Have you had an respiratory symptoms (SOB or cough) since your procedure? no  3.   Have you tested positive for COVID 19 since your procedure no  4.   Have you had any family members/close contacts diagnosed with the COVID 19 since your procedure?  no   If yes to any of these questions please route to Joylene John, RN and Alphonsa Gin, Therapist, sports.

## 2019-05-05 ENCOUNTER — Encounter: Payer: BLUE CROSS/BLUE SHIELD | Admitting: Osteopathic Medicine

## 2019-05-07 ENCOUNTER — Encounter: Payer: Self-pay | Admitting: Osteopathic Medicine

## 2019-05-07 ENCOUNTER — Other Ambulatory Visit: Payer: Self-pay

## 2019-05-07 ENCOUNTER — Encounter: Payer: Self-pay | Admitting: Gastroenterology

## 2019-05-07 ENCOUNTER — Ambulatory Visit (INDEPENDENT_AMBULATORY_CARE_PROVIDER_SITE_OTHER): Payer: BC Managed Care – PPO | Admitting: Osteopathic Medicine

## 2019-05-07 VITALS — BP 127/87 | HR 87 | Temp 97.9°F | Ht 67.0 in | Wt 235.0 lb

## 2019-05-07 DIAGNOSIS — Z1331 Encounter for screening for depression: Secondary | ICD-10-CM | POA: Diagnosis not present

## 2019-05-07 DIAGNOSIS — Z0000 Encounter for general adult medical examination without abnormal findings: Secondary | ICD-10-CM | POA: Diagnosis not present

## 2019-05-07 NOTE — Progress Notes (Signed)
HPI: Nicholas Steele is a 38 y.o. 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  he presents to [facility] today, 05/07/19,  for chief complaint of: Annual physical     Patient here for annual physical / wellness exam.  See preventive care reviewed as below.    Additional concerns today include:   (+)depression screening -reports some increased stress at work and with get counseling for free through his employer if needed.  No thoughts of hurting himself or others.  Had some difficulty recovering from the colonoscopy, decreased appetite and abdominal pain.  Seems to be getting better now.  Appetite has improved.  He did not alert gastroenterology about these issues    Depression screen [facility] 2/9 05/07/2019 06/18/2018 06/29/2017  Decreased Interest 2 0 1  Down, Depressed, Hopeless 1 0 1  PHQ - 2 Score 3 0 2  Altered sleeping _0 Tired, decreased energy _1 Change in appetite _2 Feeling bad or failure about yourself  _3 Trouble concentrating 0 0 0  Moving slowly or fidgety/restless 0 0 0  Suicidal thoughts 1 0 0  PHQ-9 Score _4 Difficult doing work/chores Somewhat difficult Not difficult at all Not difficult at all     Past medical, surgical, social and family history reviewed:  Patient Active Problem List   Diagnosis Date Noted  . Genetic testing 03/28/2019  . MLH1-related Lynch syndrome (HNPCC2) 03/28/2019  . Family history of Lynch syndrome   . Family history of colon cancer   . Family history of pancreatic cancer   . Family history of lung cancer   . Symptoms consistent with irritable bowel syndrome 08/02/2018  . Plantar fasciitis 07/23/2017  . Fatty  liver 02/23/2016  . Hiatal hernia 02/23/2016  . Gallstones 02/23/2016  . 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 Diverticulosis 02/23/2016  . Cubital tunnel syndrome on right 06/18/2015  . Annual physical exam 05/21/2015  . Lipid screening 05/21/2015  . 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 COLONOSCOPY    . HERNIA REPAIR      Social History   Tobacco Use  . Smoking status: Never Smoker  . Smokeless tobacco: Never Used  Substance Use Topics  . Alcohol use: Yes    Comment: Socially    Family History  Problem Relation Age of Onset  . Hyperlipidemia Father   . Hypertension Father   . Stroke Father   . Colon cancer Father 34       Lynch +  . Colon polyps Father   . Depression Maternal Aunt   . Cancer Maternal Aunt        unk type  . Alcohol abuse Paternal Uncle   . Colon cancer Paternal Uncle        Lynch+  . Diabetes Paternal Uncle   . Diabetes Maternal Grandfather   . Lung cancer Maternal Grandfather   . Colon cancer Paternal Grandfather   . Colon polyps Paternal Grandfather   . Colon cancer Paternal Aunt        Lynch +  . Colon polyps Paternal Aunt   . Other Brother  Lynch Syndrome  . Colon cancer Paternal Aunt        Lynch +  . Pancreatic cancer Paternal Aunt   . Colon polyps Paternal Aunt   . Esophageal cancer Neg Hx   . Rectal cancer Neg Hx   . Stomach cancer Neg Hx      Current medication list and allergy/intolerance information reviewed:    Current Outpatient Medications  Medication Sig Dispense Refill  . dicyclomine (BENTYL) 10 MG capsule Take 1 capsule (10 mg total) by mouth 4 (four) times daily -  before meals and at bedtime. 120 capsule 11  . ondansetron (ZOFRAN) 4 MG tablet Take 1 tablet (4 mg total) by mouth as directed. 2 tablet 0   No current facility-administered medications for this visit.     Allergies  Allergen Reactions  . Amoxicillin Other (See Comments)       Review of Systems:  Constitutional:  No  fever, no chills, No recent illness, No unintentional weight changes. No significant fatigue.   HEENT: No  headache, no vision change, no hearing change, No sore throat, No  sinus pressure  Cardiac: No  chest pain, No  pressure, No palpitations, No  Orthopnea  Respiratory:  No  shortness of breath. No  Cough  Gastrointestinal: No  abdominal pain, No  nausea, No  vomiting,  No  blood in stool, No  diarrhea, No  constipation   Musculoskeletal: No new myalgia/arthralgia  Skin: No  Rash, No other wounds/concerning lesions  Hem/Onc: No  easy bruising/bleeding, No  abnormal lymph node  Endocrine: No cold intolerance,  No heat intolerance. No polyuria/polydipsia/polyphagia   Neurologic: No  weakness, No  dizziness, No  slurred speech/focal weakness/facial droop  Psychiatric: + concerns with depression, No  concerns with anxiety, No sleep problems, No mood problems  Exam:  BP 127/87   Pulse 87   Temp 97.9 F (36.6 C)   Ht _0  (1.702 m)   Wt 235 lb (106.6 kg)   BMI 36.81 kg/m   Constitutional: VS see above. General Appearance: alert, well-developed, well-nourished, NAD  Eyes: Normal lids and conjunctive, non-icteric sclera  Neck: No masses, trachea midline. No thyroid enlargement. No tenderness/mass appreciated. No lymphadenopathy  Respiratory: Normal respiratory effort. no wheeze, no rhonchi, no rales  Cardiovascular: S1/S2 normal, no murmur, no rub/gallop auscultated. RRR. No lower extremity edema.   Gastrointestinal: Nontender, no masses. No hepatomegaly, no splenomegaly. No hernia appreciated. Bowel sounds normal. Rectal exam deferred.   Musculoskeletal: Gait normal. No clubbing/cyanosis of digits.   Neurological: Normal balance/coordination. No tremor. No cranial nerve deficit on limited exam. Motor and sensation intact and symmetric. Cerebellar reflexes intact.   Skin: warm, dry, intact. No rash/ulcer. No concerning nevi or  subq nodules on limited exam.    Psychiatric: Normal judgment/insight. Normal mood and affect. Oriented x3.      ASSESSMENT/PLAN: The primary encounter diagnosis was Annual physical exam. A diagnosis of Positive depression screening was also pertinent to this visit.   Patient was advised to let me know if discussed mental health care more, whether medications or counseling  Patient was advised to contact GI if concerning abdominal/bowel symptoms persist  Orders Placed This Encounter  Procedures  . CBC  . COMPLETE METABOLIC PANEL WITH GFR  . Lipid panel    No orders of the defined types were placed in this encounter.   Patient Instructions  General Preventive Care  Most recent routine labs: ordered today!    Everyone should have blood pressure  checked once per year. Your numbers have looked great!   Tobacco: don't!   Alcohol: responsible moderation is ok for most adults - if you have concerns about your alcohol use, please talk to me!   Exercise: as tolerated to reduce risk of heart disease and diabetes.  Mental health: if need for mental health care (medicines, therapy, other), or concerns about moods, please let me know!   Sexual health: if ever need for STD testing, or if concerns with libido/pain problems, please let me know!   Advanced Directive: Living Will and/or Healthcare Power of Attorney recommended for all adults, regardless of age or health.  Vaccines  Flu vaccine: recommended for almost everyone, every fall.   Shingles vaccine: Shingrix recommended after age 57.   Pneumonia vaccines: Prevnar and Pneumovax recommended after age 60.  Tetanus booster: Tdap recommended every 10 years. Will be due 2029 Cancer screenings   Colon cancer screening: as directed by GI specialist   Prostate cancer screening: PSA blood test around age 47  Lung cancer screening: not needed if no smoking  Infection screenings . HIV, Gonorrhea/Chlamydia: screening as  needed. . Hepatitis C: recommended for anyone born 60-1966 . TB: certain at-risk populations, or depending on work requirements and/or travel history        Visit summary with medication list and pertinent instructions was printed for patient to review. All questions at time of visit were answered - patient instructed to contact office with any additional concerns or updates. ER/RTC precautions were reviewed with the patient.     Please note: voice recognition software was used to produce this document, and typos may escape review. Please contact Dr. Sheppard Coil for any needed clarifications.     Follow-up plan: Return in about 1 year (around 05/06/2020) for [employer] (call week prior to visit for lab orders).

## 2019-05-07 NOTE — Patient Instructions (Addendum)
General Preventive Care  Most recent routine labs: ordered today!    Everyone should have blood pressure checked once per year. Your numbers have looked great!   Tobacco: don't!   Alcohol: responsible moderation is ok for most adults - if you have concerns about your alcohol use, please talk to me!   Exercise: as tolerated to reduce risk of heart disease and diabetes.  Mental health: if need for mental health care (medicines, therapy, other), or concerns about moods, please let me know!   Sexual health: if ever need for STD testing, or if concerns with libido/pain problems, please let me know!   Advanced Directive: Living Will and/or Healthcare Power of Attorney recommended for all adults, regardless of age or health.  Vaccines  Flu vaccine: recommended for almost everyone, every fall.   Shingles vaccine: Shingrix recommended after age 70.   Pneumonia vaccines: Prevnar and Pneumovax recommended after age 54.  Tetanus booster: Tdap recommended every 10 years. Will be due 2029 Cancer screenings   Colon cancer screening: as directed by GI specialist   Prostate cancer screening: PSA blood test around age 92  Lung cancer screening: not needed if no smoking  Infection screenings . HIV, Gonorrhea/Chlamydia: screening as needed. . Hepatitis C: recommended for anyone born 55-1965 . TB: certain at-risk populations, or depending on work requirements and/or travel history

## 2019-08-04 DIAGNOSIS — Z20828 Contact with and (suspected) exposure to other viral communicable diseases: Secondary | ICD-10-CM | POA: Diagnosis not present

## 2019-08-08 ENCOUNTER — Encounter: Payer: Self-pay | Admitting: Osteopathic Medicine

## 2019-08-08 ENCOUNTER — Telehealth: Payer: Self-pay

## 2019-08-08 NOTE — Telephone Encounter (Signed)
Pt has been updated and notified that letter will be mailed to address on file. Pt informed of provider's recommendation. Address confirmed. No other inquiries during call.

## 2019-08-08 NOTE — Telephone Encounter (Signed)
Letter printed, placed in my assistant's in basket to mail

## 2019-08-08 NOTE — Telephone Encounter (Signed)
Pt called stating that he tested positive for Covid 19 this week. His symptoms began on 08/02/19. Pt requesting recommendation from provider. Pt did not express whether he continued to have any lingering symptoms. Pls advise, thanks.

## 2020-02-20 ENCOUNTER — Telehealth: Payer: Self-pay

## 2020-02-20 NOTE — Telephone Encounter (Signed)
Pt called requesting if there was a way to test for audio processing disorder. He recently found out that there is family history with this disorder. Pls advise, thanks.

## 2020-02-26 NOTE — Telephone Encounter (Signed)
If he feels fine otherwise, I don't think he needs any special testng, we can discus more next time I see him in the office

## 2020-02-26 NOTE — Telephone Encounter (Signed)
I'm not sure that routine testing is needed just for family history. If he has concerning symptoms or problems, can he be specific as to what's bothering him or is  he just worried given FH?

## 2020-02-26 NOTE — Telephone Encounter (Signed)
Pt was concerned about the family history he recently discovered.

## 2020-02-27 NOTE — Telephone Encounter (Signed)
LVM for patient regarding special testing. Advised patient more discussion can be held at next office visit.  For any additional questions direct callback information provided.

## 2020-10-12 ENCOUNTER — Ambulatory Visit (INDEPENDENT_AMBULATORY_CARE_PROVIDER_SITE_OTHER): Payer: BC Managed Care – PPO | Admitting: Sports Medicine

## 2020-10-12 ENCOUNTER — Other Ambulatory Visit: Payer: Self-pay

## 2020-10-12 ENCOUNTER — Ambulatory Visit (INDEPENDENT_AMBULATORY_CARE_PROVIDER_SITE_OTHER): Payer: BC Managed Care – PPO

## 2020-10-12 DIAGNOSIS — M5136 Other intervertebral disc degeneration, lumbar region: Secondary | ICD-10-CM

## 2020-10-12 DIAGNOSIS — M25551 Pain in right hip: Secondary | ICD-10-CM | POA: Diagnosis not present

## 2020-10-12 DIAGNOSIS — S73191A Other sprain of right hip, initial encounter: Secondary | ICD-10-CM | POA: Insufficient documentation

## 2020-10-12 DIAGNOSIS — M1611 Unilateral primary osteoarthritis, right hip: Secondary | ICD-10-CM

## 2020-10-12 DIAGNOSIS — M545 Low back pain, unspecified: Secondary | ICD-10-CM | POA: Diagnosis not present

## 2020-10-12 MED ORDER — HYDROCODONE-ACETAMINOPHEN 10-325 MG PO TABS: 1.0000 | ORAL_TABLET | Freq: Three times a day (TID) | ORAL | 0 refills | Status: DC | PRN

## 2020-10-12 MED ORDER — PREDNISONE 50 MG PO TABS: ORAL_TABLET | ORAL | 0 refills | Status: DC

## 2020-10-12 NOTE — Progress Notes (Signed)
    Procedures performed today:    None.  Independent interpretation of notes and tests performed by another provider:   None.  Brief History, Exam, Impression, and Recommendations:    Right hip pain Is a very pleasant 40 year old male, couple weeks ago he was working on his car and twisted, felt a pop in his hip and had severe pain radiating from his back to his groin. On exam he has pain in the groin, worse with resisted hip flexion, also worse with flexion, adduction, internal rotation. He also has pain in his low back worse with flexion, the differential is wide including discogenic pain, hip labrum and hip flexor strain. Adding 5 days of prednisone, hydrocodone, x-rays of his hip and back, formal physical therapy. Out of work for a week, return to see me in 2 to 3 weeks to reevaluate.    ___________________________________________ Ihor Austin. Benjamin Stain, M.D., ABFM., CAQSM. Primary Care and Sports Medicine [facility]  Adjunct Instructor of Family Medicine  [employer]

## 2020-10-12 NOTE — Assessment & Plan Note (Signed)
Is a very pleasant 40 year old male, couple weeks ago he was working on his car and twisted, felt a pop in his hip and had severe pain radiating from his back to his groin. On exam he has pain in the groin, worse with resisted hip flexion, also worse with flexion, adduction, internal rotation. He also has pain in his low back worse with flexion, the differential is wide including discogenic pain, hip labrum and hip flexor strain. Adding 5 days of prednisone, hydrocodone, x-rays of his hip and back, formal physical therapy. Out of work for a week, return to see me in 2 to 3 weeks to reevaluate.

## 2020-10-14 ENCOUNTER — Telehealth: Payer: Self-pay

## 2020-10-14 DIAGNOSIS — M545 Low back pain, unspecified: Secondary | ICD-10-CM | POA: Diagnosis not present

## 2020-10-14 DIAGNOSIS — M25551 Pain in right hip: Secondary | ICD-10-CM

## 2020-10-14 MED ORDER — PREDNISONE 50 MG PO TABS: ORAL_TABLET | ORAL | 0 refills | Status: DC

## 2020-10-14 NOTE — Telephone Encounter (Signed)
Patient called to report that he dropped the last couple prednisone down the drain in the sink. Is this going to matter? Does ne need a new prescription? Please advise.

## 2020-10-14 NOTE — Telephone Encounter (Signed)
Been there, done that, refilling.

## 2020-10-15 NOTE — Telephone Encounter (Signed)
Patient aware a new prescription has been sent in.

## 2020-10-18 ENCOUNTER — Encounter: Payer: Self-pay | Admitting: Nurse Practitioner

## 2020-10-18 ENCOUNTER — Telehealth (INDEPENDENT_AMBULATORY_CARE_PROVIDER_SITE_OTHER): Payer: BC Managed Care – PPO | Admitting: Nurse Practitioner

## 2020-10-18 ENCOUNTER — Telehealth: Payer: Self-pay

## 2020-10-18 DIAGNOSIS — Z20822 Contact with and (suspected) exposure to covid-19: Secondary | ICD-10-CM | POA: Diagnosis not present

## 2020-10-18 DIAGNOSIS — R059 Cough, unspecified: Secondary | ICD-10-CM

## 2020-10-18 DIAGNOSIS — J029 Acute pharyngitis, unspecified: Secondary | ICD-10-CM | POA: Diagnosis not present

## 2020-10-18 MED ORDER — LIDOCAINE VISCOUS HCL 2 % MT SOLN: 15.0000 mL | OROMUCOSAL | 0 refills | Status: AC | PRN

## 2020-10-18 NOTE — Telephone Encounter (Signed)
Pt called complaining of flu-like symptoms. H/a's, chills, congestion and throat discomfort. Pls contact pt to schedule a virtual appointment with provider. Thanks in advance.

## 2020-10-18 NOTE — Telephone Encounter (Signed)
Acute Virtual scheduled with Sarabeth Early for 10/18/20 since Dr A is booked. AM

## 2020-10-18 NOTE — Patient Instructions (Signed)
Over the counter medications that may be helpful for symptoms: . Guaifenesin 1200 mg extended release tabs twice daily, with plenty of water o For cough and congestion o Brand name: Mucinex   . Pseudoephedrine 30 mg, one or two tabs every 4 to 6 hours o For sinus congestion o Brand name: Sudafed o You must get this from the pharmacy counter.  . Oxymetazoline nasal spray each morning, one spray in each nostril, for NO MORE THAN 3 days  o For nasal and sinus congestion o Brand name: Afrin . Saline nasal spray or Saline Nasal Irrigation 3-5 times a day o For nasal and sinus congestion o Brand names: [place] or AYR . Fluticasone nasal spray, one spray in each nostril, each morning after oxymetazoline and saline, if used o For nasal and sinus congestion o Brand name: Flonase . Warm salt water gargles  o For sore throat o Every few hours as needed . Alternate ibuprofen 400-600 mg and acetaminophen 1000 mg every 4-6 hours o For fever, body aches, headache o Brand names: Motrin or Advil and Tylenol . Dextromethorphan 12-hour cough version 30 mg every 12 hours  o For cough o Brand name: Delsym Stop all other cold medications for now (Nyquil, Dayquil, Tylenol Cold, Theraflu, etc) and other non-prescription cough/cold preparations. Many of these have the same ingredients listed above and could cause an overdose of medication.   General Instructions . Allow your body to rest . Drink PLENTY of fluids . Isolate yourself from everyone, even family, until test results have returned  If your COVID-19 test is positive . Then you ARE INFECTED and you can pass the virus to others . You must quarantine from others for a minimum of  o 10 days since symptoms started AND o You are fever free for 24 hours WITHOUT any medication to reduce fever AND o Your symptoms are improving . Do not go to the store or other public areas . Do not go around household members who are not known to be infected with  COVID-19 . If you MUST leave you area of quarantine (example: go to a bathroom you share with others in your home), you must o Wear a mask o Wash your hands thoroughly o Wipe down any surfaces you touch . Do not share food, drinks, towels, or other items with other persons . Dispose of your own tissues, food containers, etc  Once you have recovered, please continue good preventive care measures, including:  . wearing a mask when in public . wash your hands frequently . avoid touching your face/nose/eyes . cover coughs/sneezes with the inside of your elbow . stay out of crowds . keep a 6 foot distance from others  Go to the nearest hospital emergency room if fever/cough/breathlessness are severe or illness seems like a threat to life.

## 2020-10-18 NOTE — Progress Notes (Signed)
Virtual Visit via Telephone Note  I connected with  Nicholas Steele on 10/18/20 at  2:10 PM EST by telephone and verified that I am speaking with the correct person using two identifiers.   I discussed the limitations, risks, security and privacy concerns of performing an evaluation and management service by telephone and the availability of in person appointments. I also discussed with the patient that there may be a patient responsible charge related to this service. The patient expressed understanding and agreed to proceed.  Participating parties included in this telephone visit include: The patient and the nurse practitioner listed. The patient is: At home I am: In the office  Subjective:    CC: Cough, sore throat, chills.   HPI: Nicholas Steele is a 40 y.o. year old male presenting today via telephone visit to discuss severe sore throat, cough, and increased mucous production, chills, rhinorrhea, sinus pain and pressure, headache, and fatigue. Symptoms started Friday 10/15/2020.   Denies shortness of breath, body aches, loss of taste or loss of smell, chest pain, palpitations, or fevers.   He has not had a flu vaccine this season or COVID vaccine.   He has been using Mucinex, then switched to theraflu which has been somewhat helpful for symptoms and sore throat.   Past medical history, Surgical history, Family history not pertinant except as noted below, Social history, Allergies, and medications have been entered into the medical record, reviewed, and corrections made.   Review of Systems:  All review of systems negative except what is listed in the HPI  Objective:    General:  Patient speaking clearly in complete sentences. No shortness of breath noted.   Alert and oriented x3.   Normal judgment.  No apparent acute distress. Mild audible congestion present.   Impression and Recommendations:    1. Encounter by telehealth for suspected COVID-19  2. Acute sore throat -  lidocaine (XYLOCAINE) 2 % solution; Use as directed 15 mLs in the mouth or throat every 3 (three) hours as needed (mouth/throat pain - gargle and spit).  Dispense: 100 mL; Refill: 0  3. Cough  Symptoms and presentation consistent with possible COVID-19 infection. He does work around other people who have been sick recently, although he is not sure of their symptoms or what they have been sick with. Recommend testing today- information provided on community testing sites. He will let us know his results.  Work note provided to detail that he is being testing and pending results. Recommended OTC management with theraflu or flonase, rest, increased hydration.  Script for lidocaine gargle provided.  Follow-up if symptoms worsen or fail to improve.    I discussed the assessment and treatment Steele with the patient. The patient was provided an opportunity to ask questions and all were answered. The patient agreed with the Steele and demonstrated an understanding of the instructions.   The patient was advised to call back or seek an in-person evaluation if the symptoms worsen or if the condition fails to improve as anticipated.  I provided 20 minutes of non-face-to-face time during this TELEPHONE encounter.    Orma Render, NP

## 2020-11-02 DIAGNOSIS — M9903 Segmental and somatic dysfunction of lumbar region: Secondary | ICD-10-CM | POA: Diagnosis not present

## 2020-11-02 DIAGNOSIS — M9904 Segmental and somatic dysfunction of sacral region: Secondary | ICD-10-CM | POA: Diagnosis not present

## 2020-11-02 DIAGNOSIS — M9906 Segmental and somatic dysfunction of lower extremity: Secondary | ICD-10-CM | POA: Diagnosis not present

## 2020-11-02 DIAGNOSIS — M9902 Segmental and somatic dysfunction of thoracic region: Secondary | ICD-10-CM | POA: Diagnosis not present

## 2020-11-04 DIAGNOSIS — M545 Low back pain, unspecified: Secondary | ICD-10-CM | POA: Diagnosis not present

## 2020-11-04 DIAGNOSIS — M25551 Pain in right hip: Secondary | ICD-10-CM | POA: Diagnosis not present

## 2020-11-09 ENCOUNTER — Ambulatory Visit (INDEPENDENT_AMBULATORY_CARE_PROVIDER_SITE_OTHER): Payer: BC Managed Care – PPO | Admitting: Sports Medicine

## 2020-11-09 ENCOUNTER — Ambulatory Visit (INDEPENDENT_AMBULATORY_CARE_PROVIDER_SITE_OTHER): Payer: BC Managed Care – PPO

## 2020-11-09 ENCOUNTER — Other Ambulatory Visit: Payer: Self-pay

## 2020-11-09 ENCOUNTER — Encounter: Payer: Self-pay | Admitting: Sports Medicine

## 2020-11-09 DIAGNOSIS — M25551 Pain in right hip: Secondary | ICD-10-CM | POA: Diagnosis not present

## 2020-11-09 NOTE — Assessment & Plan Note (Signed)
Nicholas Steele returns, he is a pleasant 40 year old male, approximately 6-week ago he was working on his car, twisted, felt a pop in his hip and severe pain from back to groin. He did have pain in the groin on exam worse with resisted hip flexion, also with a positive FADIR sign. 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 He is a good amount better but tells me he still has significant pain in spite of work with physical therapy and chiropractic relation to the point where he would like an injection, today I performed a femoral acetabular joint injection, return to see me 1 month afterwards.

## 2020-11-09 NOTE — Progress Notes (Signed)
    Procedures performed today:    Procedure: Real-time Ultrasound Guided injection of the right hip joint Device: Samsung HS60  Verbal informed consent obtained.  Time-out conducted.  Noted no overlying erythema, induration, or other signs of local infection.  Skin prepped in a sterile fashion.  Local anesthesia: Topical Ethyl chloride.  With sterile technique and under real time ultrasound guidance:  Noted arthritic right hip joint, 1 cc Kenalog 40, 2 cc lidocaine, 2 cc bupivacaine injected easily Completed without difficulty  Advised to call if fevers/chills, erythema, induration, drainage, or persistent bleeding.  Images permanently stored and available for review in PACS.  Impression: Technically successful ultrasound guided injection.  Independent interpretation of notes and tests performed by another provider:   None.  Brief History, Exam, Impression, and Recommendations:    Right hip pain Nicholas Steele returns, he is a pleasant 40 year old male, approximately 6-week ago he was working on his car, twisted, felt a pop in his hip and severe pain from back to groin. He did have pain in the groin on exam worse with resisted hip flexion, also with a positive FADIR sign. 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 He is a good amount better but tells me he still has significant pain in spite of work with physical therapy and chiropractic relation to the point where he would like an injection, today I performed a femoral acetabular joint injection, return to see me 1 month afterwards.    ___________________________________________ Gwen Her. Dianah Field, M.D., ABFM., CAQSM. Primary Care and [place] Instructor of [place] of [facility] of Medicine

## 2020-11-09 NOTE — Addendum Note (Signed)
Addended by: Dema Severin on: 11/09/2020 09:48 AM   Modules accepted: Orders

## 2020-11-11 DIAGNOSIS — M25551 Pain in right hip: Secondary | ICD-10-CM | POA: Diagnosis not present

## 2020-11-11 DIAGNOSIS — M545 Low back pain, unspecified: Secondary | ICD-10-CM | POA: Diagnosis not present

## 2020-11-18 DIAGNOSIS — M545 Low back pain, unspecified: Secondary | ICD-10-CM | POA: Diagnosis not present

## 2020-11-18 DIAGNOSIS — M25551 Pain in right hip: Secondary | ICD-10-CM | POA: Diagnosis not present

## 2020-12-02 ENCOUNTER — Ambulatory Visit (INDEPENDENT_AMBULATORY_CARE_PROVIDER_SITE_OTHER): Payer: BC Managed Care – PPO | Admitting: Sports Medicine

## 2020-12-02 ENCOUNTER — Other Ambulatory Visit: Payer: Self-pay

## 2020-12-02 DIAGNOSIS — M25551 Pain in right hip: Secondary | ICD-10-CM | POA: Diagnosis not present

## 2020-12-02 MED ORDER — HYDROCODONE-ACETAMINOPHEN 5-325 MG PO TABS: 1.0000 | ORAL_TABLET | Freq: Three times a day (TID) | ORAL | 0 refills | Status: DC | PRN

## 2020-12-02 NOTE — Assessment & Plan Note (Signed)
This is a pleasant 40 year old male, we treated him for severe right hip pain previously, he had an injection that provided only temporary relief, on exam he did have a positive FADIR sign, he also had some osteoarthritis of the hip on x-rays and lumbar spine. I did suspect hip labral tear, due to recurrence of pain we are going to proceed with a refill of his narcotic, hip MR arthrography, as well as light duty at work for now. Return to see me for MR arthrogram injection.

## 2020-12-02 NOTE — Progress Notes (Signed)
    Procedures performed today:    None.  Independent interpretation of notes and tests performed by another provider:   None.  Brief History, Exam, Impression, and Recommendations:    Right hip pain This is a pleasant 40 year old male, we treated him for severe right hip pain previously, he had an injection that provided only temporary relief, on exam he did have a positive FADIR sign, he also had some osteoarthritis of the hip on x-rays and lumbar spine. I did suspect hip labral tear, due to recurrence of pain we are going to proceed with a refill of his narcotic, hip MR arthrography, as well as light duty at work for now. Return to see me for MR arthrogram injection.    ___________________________________________ Gwen Her. Dianah Field, M.D., ABFM., CAQSM. Primary Care and [place] Instructor of [place] of [facility] of Medicine

## 2020-12-03 ENCOUNTER — Telehealth: Payer: Self-pay

## 2020-12-03 ENCOUNTER — Other Ambulatory Visit: Payer: Self-pay | Admitting: Sports Medicine

## 2020-12-03 DIAGNOSIS — M545 Low back pain, unspecified: Secondary | ICD-10-CM | POA: Diagnosis not present

## 2020-12-03 DIAGNOSIS — Z1389 Encounter for screening for other disorder: Secondary | ICD-10-CM

## 2020-12-03 DIAGNOSIS — M25551 Pain in right hip: Secondary | ICD-10-CM | POA: Diagnosis not present

## 2020-12-03 NOTE — Telephone Encounter (Signed)
Depends on the results of the MR arthrogram, if he needs an operation it could be over a month, lets see what the arthrogram shows first.

## 2020-12-03 NOTE — Telephone Encounter (Signed)
Patient called to report that when he took his work note to his employer they put him out of work. He is trying to get an idea of how long these restrictions will be in effect. He is trying to gauge how long he may be out of work. Please advise.

## 2020-12-03 NOTE — Telephone Encounter (Signed)
Patient is aware that we need to see what the MRI shows and we are waiting to hear from insurance about the approval on the MRI.

## 2020-12-08 ENCOUNTER — Ambulatory Visit: Payer: BC Managed Care – PPO | Admitting: Sports Medicine

## 2020-12-13 ENCOUNTER — Ambulatory Visit (INDEPENDENT_AMBULATORY_CARE_PROVIDER_SITE_OTHER): Payer: BC Managed Care – PPO

## 2020-12-13 ENCOUNTER — Ambulatory Visit (INDEPENDENT_AMBULATORY_CARE_PROVIDER_SITE_OTHER): Payer: BC Managed Care – PPO | Admitting: Sports Medicine

## 2020-12-13 ENCOUNTER — Other Ambulatory Visit: Payer: Self-pay

## 2020-12-13 DIAGNOSIS — M25551 Pain in right hip: Secondary | ICD-10-CM

## 2020-12-13 DIAGNOSIS — S73191S Other sprain of right hip, sequela: Secondary | ICD-10-CM

## 2020-12-13 DIAGNOSIS — Z01818 Encounter for other preprocedural examination: Secondary | ICD-10-CM | POA: Diagnosis not present

## 2020-12-13 DIAGNOSIS — Z77018 Contact with and (suspected) exposure to other hazardous metals: Secondary | ICD-10-CM

## 2020-12-13 DIAGNOSIS — Z1389 Encounter for screening for other disorder: Secondary | ICD-10-CM | POA: Diagnosis not present

## 2020-12-13 DIAGNOSIS — S73191A Other sprain of right hip, initial encounter: Secondary | ICD-10-CM | POA: Diagnosis not present

## 2020-12-13 DIAGNOSIS — Z0189 Encounter for other specified special examinations: Secondary | ICD-10-CM

## 2020-12-13 MED ORDER — GADOBUTROL 1 MMOL/ML IV SOLN
1.0000 mL | Freq: Once | INTRAVENOUS | Status: AC | PRN
  Administered 2020-12-13: 1 mL via INTRAVENOUS

## 2020-12-13 NOTE — Progress Notes (Addendum)
    Procedures performed today:    Procedure: Real-time Ultrasound Guided gadolinium contrast injection of right hip joint Device: Samsung HS60  Verbal informed consent obtained.  Time-out conducted.  Noted no overlying erythema, induration, or other signs of local infection.  Skin prepped in a sterile fashion.  Local anesthesia: Topical Ethyl chloride.  With sterile technique and under real time ultrasound guidance: Noted normal-appearing hip joint, 22-gauge spinal needle advanced to the femoral head/neck junction, contacted bone and injected 2 cc lidocaine, 2 cc bupivacaine, syringe switched and 0.1 cc gadolinium injected, syringe again switched and 10 cc sterile saline used to distend the joint. Joint visualized and capsule seen distending confirming intra-articular placement of contrast material and medication. Completed without difficulty  Advised to call if fevers/chills, erythema, induration, drainage, or persistent bleeding.  Images permanently stored in PACS Impression: Technically successful ultrasound guided gadolinium contrast injection for MR arthrography.  Please see separate MR arthrogram report.   Independent interpretation of notes and tests performed by another provider:   None.  Brief History, Exam, Impression, and Recommendations:    Labral tear of right hip joint This is a pleasant 40 year old male, we have been treating him for severe right hip pain, we did an injection about a month and half ago that only provided temporary relief, he had a positive FADIR sign on exam, and some osteoarthritis on x-rays, I did suspect hip labral tear, we are doing his hip arthrogram injection for MRI today. Treatment will depend on arthrogram results.  Referral to the hip center    ___________________________________________ Gwen Her. Dianah Field, M.D., ABFM., CAQSM. Primary Care and [place] Instructor of [place] of [facility] of Medicine

## 2020-12-13 NOTE — Assessment & Plan Note (Addendum)
This is a pleasant 40 year old male, we have been treating him for severe right hip pain, we did an injection about a month and half ago that only provided temporary relief, he had a positive FADIR sign on exam, and some osteoarthritis on x-rays, I did suspect hip labral tear, we are doing his hip arthrogram injection for MRI today. Treatment will depend on arthrogram results.  Referral to the hip center

## 2020-12-15 NOTE — Addendum Note (Signed)
Addended by: Silverio Decamp on: 12/15/2020 12:29 PM   Modules accepted: Orders

## 2020-12-21 DIAGNOSIS — M25551 Pain in right hip: Secondary | ICD-10-CM | POA: Diagnosis not present

## 2020-12-21 DIAGNOSIS — R269 Unspecified abnormalities of gait and mobility: Secondary | ICD-10-CM | POA: Diagnosis not present

## 2020-12-21 DIAGNOSIS — M1611 Unilateral primary osteoarthritis, right hip: Secondary | ICD-10-CM | POA: Diagnosis not present

## 2020-12-28 ENCOUNTER — Other Ambulatory Visit: Payer: Self-pay

## 2020-12-28 ENCOUNTER — Ambulatory Visit (INDEPENDENT_AMBULATORY_CARE_PROVIDER_SITE_OTHER): Payer: BC Managed Care – PPO | Admitting: Sports Medicine

## 2020-12-28 DIAGNOSIS — S73191S Other sprain of right hip, sequela: Secondary | ICD-10-CM

## 2020-12-28 DIAGNOSIS — M25551 Pain in right hip: Secondary | ICD-10-CM

## 2020-12-28 MED ORDER — DICLOFENAC SODIUM 75 MG PO TBEC: 75.0000 mg | DELAYED_RELEASE_TABLET | Freq: Two times a day (BID) | ORAL | 3 refills | Status: AC

## 2020-12-28 MED ORDER — HYDROCODONE-ACETAMINOPHEN 5-325 MG PO TABS: 1.0000 | ORAL_TABLET | Freq: Three times a day (TID) | ORAL | 0 refills | Status: AC | PRN

## 2020-12-28 NOTE — Assessment & Plan Note (Signed)
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 He did touch base with Dr. Aretha Parrot with [facility], who has referred him for consideration of hip arthroplasty rather than arthroscopy. I will refill his pain medication and had some for him to take it were, we also redid his [employer] disability form.

## 2020-12-28 NOTE — Progress Notes (Signed)
    Procedures performed today:    None.  Independent interpretation of notes and tests performed by another provider:   None.  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 He did touch base with Dr. Aretha Parrot with [facility] hip center, who has referred him for consideration of hip arthroplasty rather than arthroscopy. I will refill his pain medication and had some for him to take it were, we also redid his [employer] disability form.    ___________________________________________ Gwen Her. Dianah Field, M.D., ABFM., CAQSM. Primary Care and [place] Instructor of [place] of [facility] of Medicine

## 2021-01-13 DIAGNOSIS — R269 Unspecified abnormalities of gait and mobility: Secondary | ICD-10-CM | POA: Diagnosis not present

## 2021-01-14 DIAGNOSIS — M1611 Unilateral primary osteoarthritis, right hip: Secondary | ICD-10-CM | POA: Diagnosis not present

## 2022-03-07 DIAGNOSIS — L255 Unspecified contact dermatitis due to plants, except food: Secondary | ICD-10-CM | POA: Diagnosis not present

## 2022-04-26 DIAGNOSIS — R197 Diarrhea, unspecified: Secondary | ICD-10-CM | POA: Diagnosis not present

## 2022-04-26 DIAGNOSIS — R0789 Other chest pain: Secondary | ICD-10-CM | POA: Diagnosis not present

## 2022-04-26 DIAGNOSIS — R079 Chest pain, unspecified: Secondary | ICD-10-CM | POA: Diagnosis not present

## 2022-04-26 DIAGNOSIS — R918 Other nonspecific abnormal finding of lung field: Secondary | ICD-10-CM | POA: Diagnosis not present

## 2022-04-26 DIAGNOSIS — R1013 Epigastric pain: Secondary | ICD-10-CM | POA: Diagnosis not present

## 2022-04-26 DIAGNOSIS — Z880 Allergy status to penicillin: Secondary | ICD-10-CM | POA: Diagnosis not present

## 2022-04-27 ENCOUNTER — Encounter: Payer: Self-pay | Admitting: Gastroenterology

## 2022-05-30 ENCOUNTER — Telehealth: Payer: Self-pay | Admitting: *Deleted

## 2022-05-30 NOTE — Telephone Encounter (Signed)
Attempted to call x 3,received recording call can not be completed as dialed,unable to leave voice mail will send no show letter and procedure will be cancelled if no call back to reschedule.

## 2022-05-30 NOTE — Telephone Encounter (Signed)
No return call received pre-visit and procedure cancelled. No show letter sent via my chart and mail.

## 2022-06-01 ENCOUNTER — Encounter: Payer: Self-pay | Admitting: Gastroenterology

## 2022-06-20 ENCOUNTER — Encounter: Payer: BC Managed Care – PPO | Admitting: Gastroenterology

## 2022-06-21 ENCOUNTER — Ambulatory Visit (AMBULATORY_SURGERY_CENTER): Payer: Self-pay | Admitting: *Deleted

## 2022-06-21 VITALS — Ht 67.0 in | Wt 247.0 lb

## 2022-06-21 DIAGNOSIS — Z1509 Genetic susceptibility to other malignant neoplasm: Secondary | ICD-10-CM

## 2022-06-21 DIAGNOSIS — Z86010 Personal history of colonic polyps: Secondary | ICD-10-CM

## 2022-06-21 DIAGNOSIS — Z800 Family history of malignant neoplasm of digestive organs: Secondary | ICD-10-CM

## 2022-06-21 MED ORDER — NA SULFATE-K SULFATE-MG SULF 17.5-3.13-1.6 GM/177ML PO SOLN: 1.0000 | Freq: Once | ORAL | 0 refills | Status: AC

## 2022-06-21 NOTE — Progress Notes (Signed)
No egg or soy allergy known to patient  No issues known to pt with past sedation with any surgeries or procedures Patient denies ever being told they had issues or difficulty with intubation  No FH of Malignant Hyperthermia Pt is not on diet pills Pt is not on home 02  Pt is not on blood thinners  Pt denies issues with constipation  No A fib or A flutter Have any cardiac testing pending--NO Pt instructed to use [email or web address] or GoodRx for a price reduction on prep   

## 2022-06-28 DIAGNOSIS — Z86010 Personal history of colonic polyps: Secondary | ICD-10-CM | POA: Diagnosis not present

## 2022-06-28 DIAGNOSIS — R197 Diarrhea, unspecified: Secondary | ICD-10-CM | POA: Diagnosis not present

## 2022-06-28 DIAGNOSIS — Z1509 Genetic susceptibility to other malignant neoplasm: Secondary | ICD-10-CM | POA: Diagnosis not present

## 2022-06-28 DIAGNOSIS — R1013 Epigastric pain: Secondary | ICD-10-CM | POA: Diagnosis not present

## 2022-06-28 DIAGNOSIS — M1611 Unilateral primary osteoarthritis, right hip: Secondary | ICD-10-CM | POA: Diagnosis not present

## 2022-06-28 DIAGNOSIS — Z8719 Personal history of other diseases of the digestive system: Secondary | ICD-10-CM | POA: Diagnosis not present

## 2022-06-28 DIAGNOSIS — R1032 Left lower quadrant pain: Secondary | ICD-10-CM | POA: Diagnosis not present

## 2022-06-28 DIAGNOSIS — R1031 Right lower quadrant pain: Secondary | ICD-10-CM | POA: Diagnosis not present

## 2022-07-04 IMAGING — DX DG HIP (WITH OR WITHOUT PELVIS) 2-3V*R*
3 series · 3 of 3 positions shown · non-contrast
Comparison: Lumbar spine radiographs-earlier same day

CLINICAL DATA: Right hip pain for the past 3 weeks.

EXAM:
DG HIP (WITH OR WITHOUT PELVIS) 2-3V RIGHT

[pelvis ap]
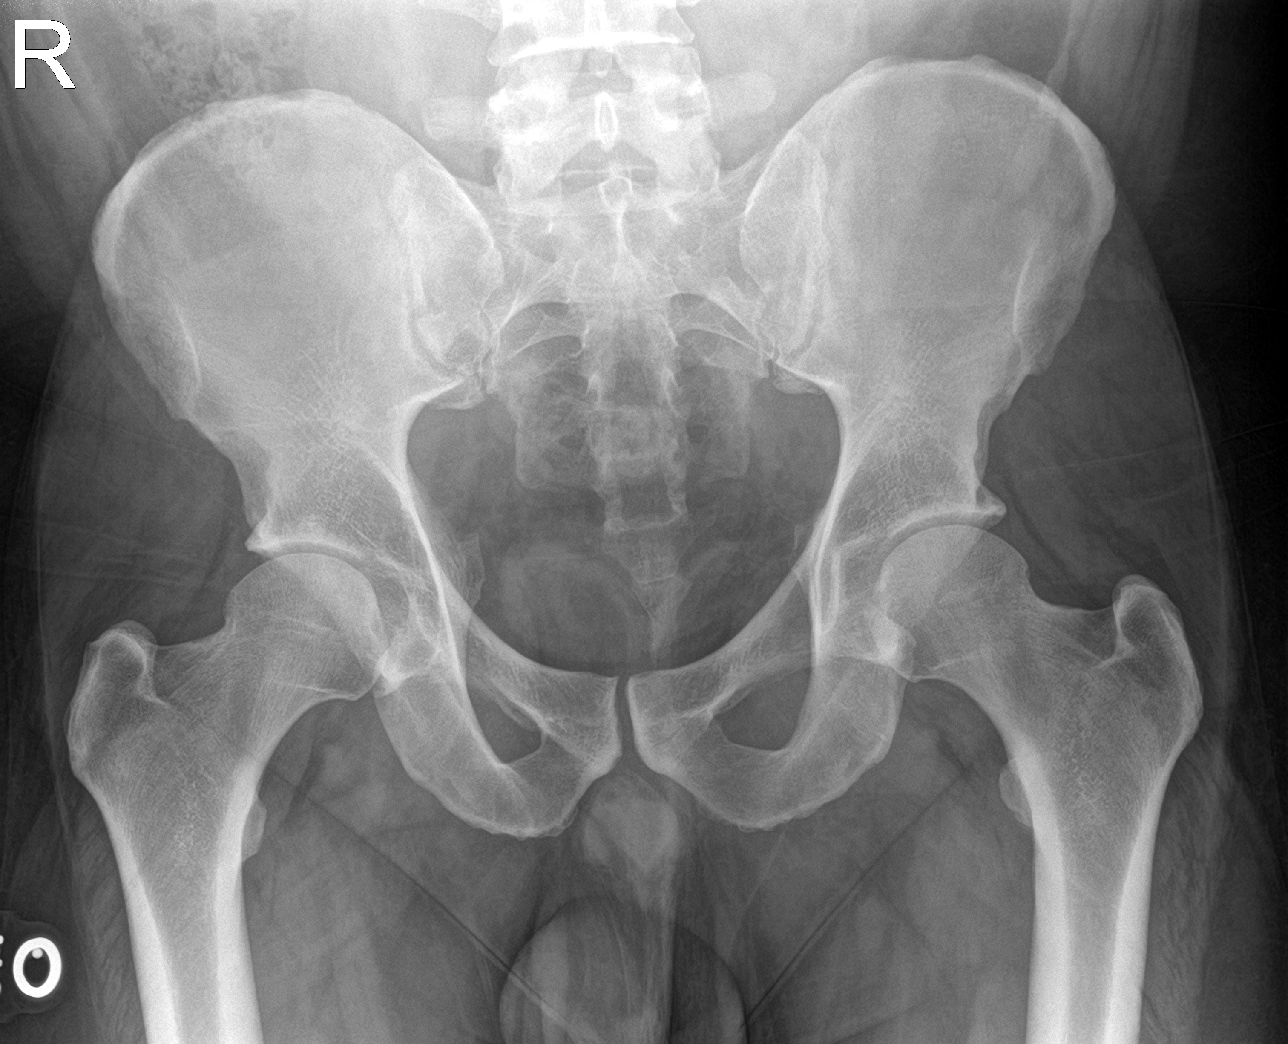

[hip ap]
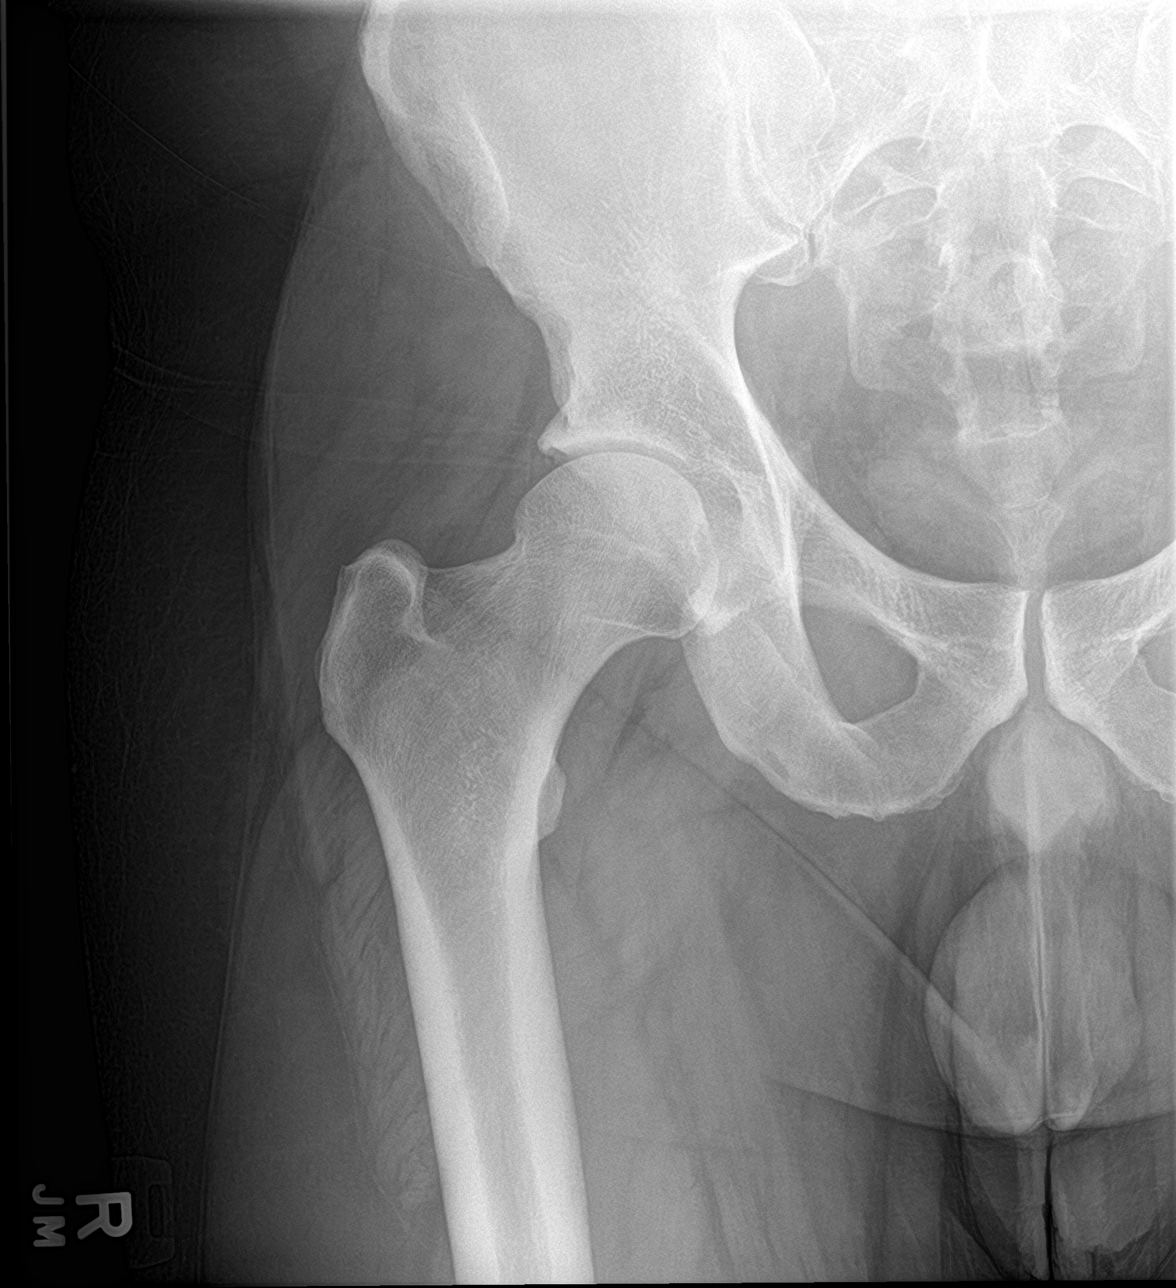

[hip frog leg]
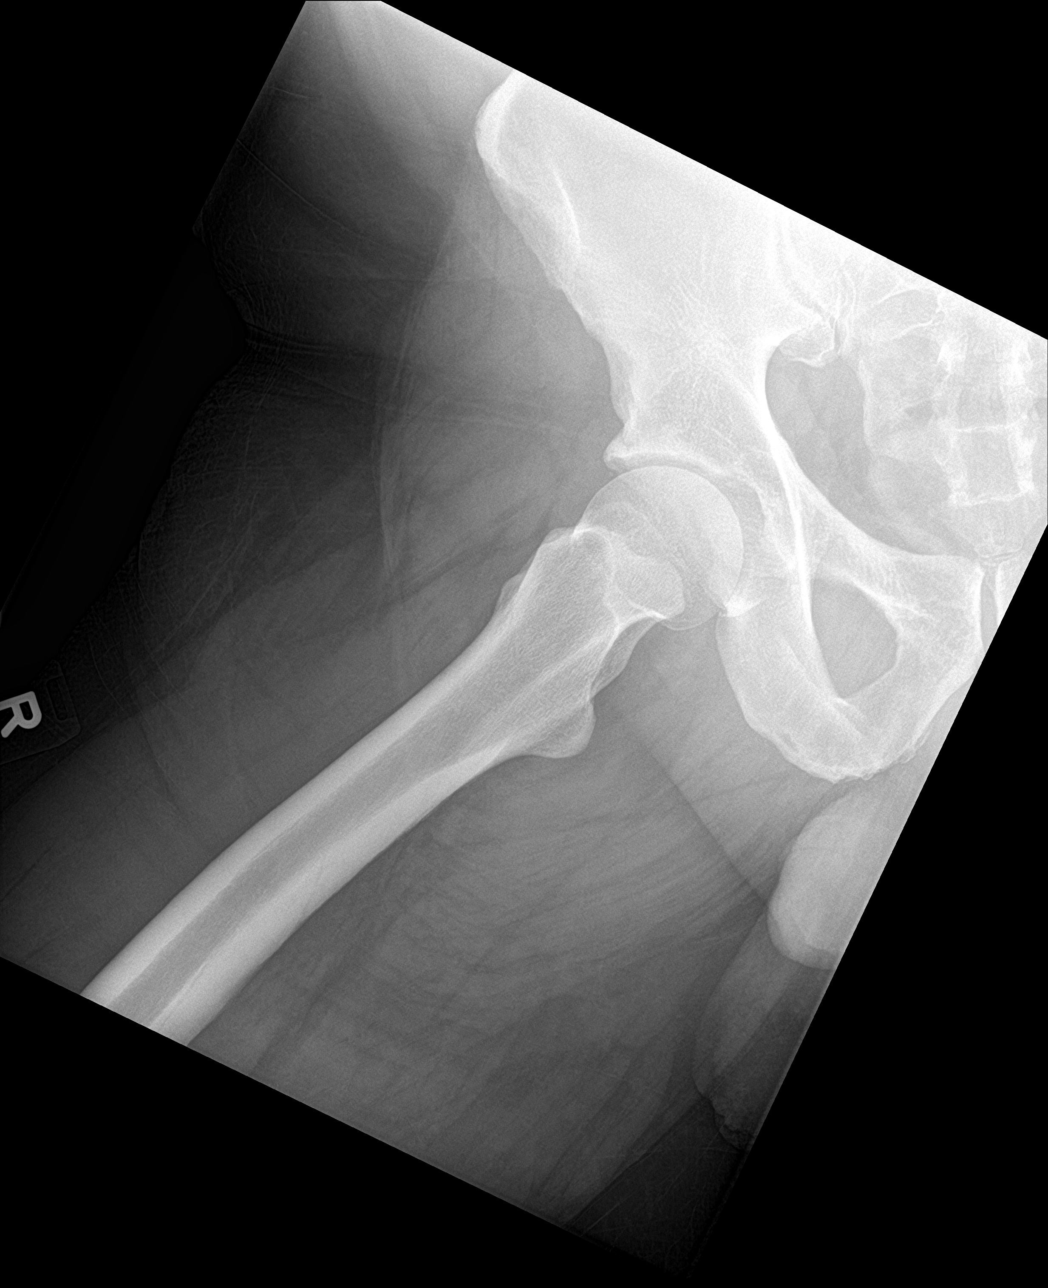

[3 of 3 positions shown; findings below may reference images not displayed]

FINDINGS: No fracture or dislocation.

Mild degenerative change of the right hip with joint space loss,
subchondral sclerosis and osteophytosis. No evidence of avascular
necrosis. Note is made of a tiny right-sided os acetabuli.

Limited visualization of the pelvis is normal. Similar mild
degenerative change is suspected within the contralateral left hip
though incompletely evaluated.

Regional soft tissues appear normal.
IMPRESSION: Mild degenerative change of the right hip.

## 2022-07-05 ENCOUNTER — Encounter: Payer: Self-pay | Admitting: Gastroenterology

## 2022-07-18 ENCOUNTER — Encounter: Payer: Self-pay | Admitting: Gastroenterology

## 2022-07-18 ENCOUNTER — Ambulatory Visit (AMBULATORY_SURGERY_CENTER): Payer: BC Managed Care – PPO | Admitting: Gastroenterology

## 2022-07-18 VITALS — BP 108/75 | HR 70 | Temp 98.6°F | Resp 14 | Ht 67.0 in | Wt 247.0 lb

## 2022-07-18 DIAGNOSIS — K635 Polyp of colon: Secondary | ICD-10-CM | POA: Diagnosis not present

## 2022-07-18 DIAGNOSIS — Z1509 Genetic susceptibility to other malignant neoplasm: Secondary | ICD-10-CM | POA: Diagnosis not present

## 2022-07-18 DIAGNOSIS — R197 Diarrhea, unspecified: Secondary | ICD-10-CM

## 2022-07-18 DIAGNOSIS — D122 Benign neoplasm of ascending colon: Secondary | ICD-10-CM

## 2022-07-18 DIAGNOSIS — Z09 Encounter for follow-up examination after completed treatment for conditions other than malignant neoplasm: Secondary | ICD-10-CM | POA: Diagnosis not present

## 2022-07-18 DIAGNOSIS — K5140 Inflammatory polyps of colon without complications: Secondary | ICD-10-CM

## 2022-07-18 DIAGNOSIS — Z1211 Encounter for screening for malignant neoplasm of colon: Secondary | ICD-10-CM | POA: Diagnosis not present

## 2022-07-18 DIAGNOSIS — Z800 Family history of malignant neoplasm of digestive organs: Secondary | ICD-10-CM

## 2022-07-18 DIAGNOSIS — Z86010 Personal history of colonic polyps: Secondary | ICD-10-CM | POA: Diagnosis not present

## 2022-07-18 DIAGNOSIS — D123 Benign neoplasm of transverse colon: Secondary | ICD-10-CM

## 2022-07-18 MED ORDER — SODIUM CHLORIDE 0.9 % IV SOLN: 500.0000 mL | Freq: Once | INTRAVENOUS | Status: DC

## 2022-07-18 NOTE — Op Note (Signed)
[facility] Patient Name: Nicholas Steele Procedure Date: 07/18/2022 9:26 AM MRN: [id number] Endoscopist: Ladene Artist , MD Age: 41 Referring MD:  Date of Birth: 1981-07-16 Gender: Male Account #: [id number] Procedure:                Colonoscopy Indications:              Colon cancer screening in patient at increased                            risk: Family history of hereditary nonpolyposis                            colorectal cancer in 1st-degree relative, Personal                            history of adenomatous colon polyps Medicines:                Monitored Anesthesia Care Procedure:                Pre-Anesthesia Assessment:                           - Prior to the procedure, a History and Physical                            was performed, and patient medications and                            allergies were reviewed. The patient's tolerance of                            previous anesthesia was also reviewed. The risks                            and benefits of the procedure and the sedation                            options and risks were discussed with the patient.                            All questions were answered, and informed consent                            was obtained. Prior Anticoagulants: The patient has                            taken no previous anticoagulant or antiplatelet                            agents. ASA Grade Assessment: III - A patient with                            severe systemic disease. After reviewing the risks  and benefits, the patient was deemed in                            satisfactory condition to undergo the procedure.                           After obtaining informed consent, the colonoscope                            was passed under direct vision. Throughout the                            procedure, the patient's blood pressure, pulse, and                            oxygen saturations were  monitored continuously. The                            Olympus CF-HQ190L [id number]) Colonoscope was                            introduced through the anus and advanced to the the                            cecum, identified by appendiceal orifice and                            ileocecal valve. The terminal ileum, ileocecal                            valve, appendiceal orifice, and rectum were                            photographed. The quality of the bowel preparation                            was excellent. The colonoscopy was performed                            without difficulty. The patient tolerated the                            procedure well. Scope In: 9:29:09 AM Scope Out: 9:40:15 AM Scope Withdrawal Time: 0 hours 9 minutes 57 seconds  Total Procedure Duration: 0 hours 11 minutes 6 seconds  Findings:                 The perianal and digital rectal examinations were                            normal.                           The terminal ileum appeared normal.  A 3 mm polyp was found in the ascending colon. The                            polyp was sessile. The polyp was removed with a                            cold biopsy forceps. Resection and retrieval were                            complete.                           A 9 mm polyp was found in the transverse colon. The                            polyp was sessile. The polyp was removed with a                            cold snare. Resection and retrieval were complete.                           The exam was otherwise without abnormality on                            direct and retroflexion views. Complications:            No immediate complications. Estimated blood loss:                            None. Estimated Blood Loss:     Estimated blood loss: none. Impression:               - The examined portion of the ileum was normal.                           - One 3 mm polyp in the ascending colon,  removed                            with a cold biopsy forceps. Resected and retrieved.                           - One 9 mm polyp in the transverse colon, removed                            with a cold snare. Resected and retrieved.                           - The examination was otherwise normal on direct                            and retroflexion views. Recommendation:           - Repeat colonoscopy in 1 year for surveillance  based on pathology results.                           - Patient has a contact number available for                            emergencies. The signs and symptoms of potential                            delayed complications were discussed with the                            patient. Return to normal activities tomorrow.                            Written discharge instructions were provided to the                            patient.                           - Resume previous diet.                           - Continue present medications.                           - IBgard 1-2 po tid prn abdominal pain, crampying                           - Await pathology results.                           - GI office appt in 6 weeks. Ladene Artist, MD 07/18/2022 9:45:39 AM This report has been signed electronically.

## 2022-07-18 NOTE — Progress Notes (Signed)
To pacu, vSs. Report to [email or web address]

## 2022-07-18 NOTE — Patient Instructions (Signed)
HANDOUTS PROVIDED ON: POLYPS  The polyps removed/biopsies taken today have been sent for pathology.  The results can take 1-3 weeks to receive.  When your next colonoscopy should occur will be based on the pathology results.    You may resume your previous diet and medication schedule.  Begin taking Ibgard 1-2 tablets by mouth three times daily as needed.  Thank you for allowing [place] to care for you today!!!   YOU HAD AN ENDOSCOPIC PROCEDURE TODAY AT [facility]:   Refer to the procedure report that was given to you for any specific questions about what was found during the examination.  If the procedure report does not answer your questions, please call your gastroenterologist to clarify.  If you requested that your care partner not be given the details of your procedure findings, then the procedure report has been included in a sealed envelope for you to review at your convenience later.  YOU SHOULD EXPECT: Some feelings of bloating in the abdomen. Passage of more gas than usual.  Walking can help get rid of the air that was put into your GI tract during the procedure and reduce the bloating. If you had a lower endoscopy (such as a colonoscopy or flexible sigmoidoscopy) you may notice spotting of blood in your stool or on the toilet paper. If you underwent a bowel prep for your procedure, you may not have a normal bowel movement for a few days.  Please Note:  You might notice some irritation and congestion in your nose or some drainage.  This is from the oxygen used during your procedure.  There is no need for concern and it should clear up in a day or so.  SYMPTOMS TO REPORT IMMEDIATELY:  Following lower endoscopy (colonoscopy or flexible sigmoidoscopy):  Excessive amounts of blood in the stool  Significant tenderness or worsening of abdominal pains  Swelling of the abdomen that is new, acute  Fever of 100F or higher  Following upper endoscopy (EGD)  Vomiting of blood or  coffee ground material  New chest pain or pain under the shoulder blades  Painful or persistently difficult swallowing  New shortness of breath  Fever of 100F or higher  Black, tarry-looking stools  For urgent or emergent issues, a gastroenterologist can be reached at any hour by calling [phone]. Do not use MyChart messaging for urgent concerns.    DIET:  We do recommend a small meal at first, but then you may proceed to your regular diet.  Drink plenty of fluids but you should avoid alcoholic beverages for 24 hours.  ACTIVITY:  You should plan to take it easy for the rest of today and you should NOT DRIVE or use heavy machinery until tomorrow (because of the sedation medicines used during the test).    FOLLOW UP: Our staff will call the number listed on your records the next business day following your procedure.  We will call around 7:15- 8:00 am to check on you and address any questions or concerns that you may have regarding the information given to you following your procedure. If we do not reach you, we will leave a message.     If any biopsies were taken you will be contacted by phone or by letter within the next 1-3 weeks.  Please call us at [phone] if you have not heard about the biopsies in 3 weeks.    SIGNATURES/CONFIDENTIALITY: You and/or your care partner have signed paperwork which will be  entered into your electronic medical record.  These signatures attest to the fact that that the information above on your After Visit Summary has been reviewed and is understood.  Full responsibility of the confidentiality of this discharge information lies with you and/or your care-partner.

## 2022-07-18 NOTE — Progress Notes (Signed)
See 06/28/2022 H&P, no changes

## 2022-07-18 NOTE — Op Note (Addendum)
Nicholas Steele Patient Name: Nicholas Steele Procedure Date: 07/18/2022 9:25 AM MRN: [id number] Endoscopist: Ladene Artist , MD Age: 41 Referring MD:  Date of Birth: [birth year] Gender: Male Account #: [id number] Procedure:                Upper GI endoscopy Indications:              Surveillance for malignancy secondary to 1st-dgree                            relative with Lynch Syndrome, Diarrhea Medicines:                Monitored Anesthesia Care Procedure:                Pre-Anesthesia Assessment:                           - Prior to the procedure, a History and Physical                            was performed, and patient medications and                            allergies were reviewed. The patient's tolerance of                            previous anesthesia was also reviewed. The risks                            and benefits of the procedure and the sedation                            options and risks were discussed with the patient.                            All questions were answered, and informed consent                            was obtained. Prior Anticoagulants: The patient has                            taken no previous anticoagulant or antiplatelet                            agents. ASA Grade Assessment: III - A patient with                            severe systemic disease. After reviewing the risks                            and benefits, the patient was deemed in                            satisfactory condition to undergo the procedure.  After obtaining informed consent, the endoscope was                            passed under direct vision. Throughout the                            procedure, the patient's blood pressure, pulse, and                            oxygen saturations were monitored continuously. The                            Endoscope was introduced through the mouth, and                            advanced to  the third part of duodenum. The upper                            GI endoscopy was accomplished without difficulty.                            The patient tolerated the procedure well. Scope In: Scope Out: Findings:                 The esophagus was normal.                           The stomach was normal.                           The examined duodenum was normal. Biopsies for                            histology were taken with a cold forceps for                            evaluation of celiac disease.                           The cardia and gastric fundus were normal on                            retroflexion. Complications:            No immediate complications. Estimated Blood Loss:     Estimated blood loss: none. Impression:               - Normal esophagus.                           - Normal stomach.                           - Normal examined duodenum. Biopsied. Recommendation:           - Patient has a contact number available for  emergencies. The signs and symptoms of potential                            delayed complications were discussed with the                            patient. Return to normal activities tomorrow.                            Written discharge instructions were provided to the                            patient.                           - Resume previous diet.                           - Continue present medications.                           - Await pathology results.                           - Repeat upper endoscopy in 3 years for screening                            purposes. Ladene Artist, MD 07/18/2022 9:58:53 AM This report has been signed electronically.

## 2022-07-18 NOTE — Progress Notes (Signed)
Called to room to assist during endoscopic procedure.  Patient ID and intended procedure confirmed with present staff. Received instructions for my participation in the procedure from the performing physician.  

## 2022-07-19 ENCOUNTER — Telehealth: Payer: Self-pay

## 2022-07-19 NOTE — Telephone Encounter (Signed)
  Follow up Call-     07/18/2022    8:56 AM  Call back number  Post procedure Call Back phone  # [phone]  Permission to leave phone message Yes     Patient questions:  Do you have a fever, pain , or abdominal swelling? No. Pain Score  0 *  Have you tolerated food without any problems? Yes.    Have you been able to return to your normal activities? Yes.    Do you have any questions about your discharge instructions: Diet   No. Medications  No. Follow up visit  No.  Do you have questions or concerns about your Care? No.  Actions: * If pain score is 4 or above: No action needed, pain <4.

## 2022-08-02 ENCOUNTER — Encounter: Payer: Self-pay | Admitting: Gastroenterology

## 2022-08-30 ENCOUNTER — Encounter: Payer: Self-pay | Admitting: Physician Assistant

## 2022-08-30 ENCOUNTER — Ambulatory Visit (INDEPENDENT_AMBULATORY_CARE_PROVIDER_SITE_OTHER): Payer: BC Managed Care – PPO | Admitting: Physician Assistant

## 2022-08-30 VITALS — BP 140/68 | HR 91 | Ht 67.0 in | Wt 246.1 lb

## 2022-08-30 DIAGNOSIS — Z1509 Genetic susceptibility to other malignant neoplasm: Secondary | ICD-10-CM

## 2022-08-30 DIAGNOSIS — K580 Irritable bowel syndrome with diarrhea: Secondary | ICD-10-CM | POA: Diagnosis not present

## 2022-08-30 NOTE — Progress Notes (Signed)
Chief Complaint: Follow-up endoscopy and colonoscopy  HPI:    Nicholas Steele is a 41 year old male with a past medical history as listed below including anxiety, family history of colon cancer and Lynch syndrome as well as pancreatic cancer, known to Dr. Fuller Plan, who was referred to me by Emeterio Reeve, DO for follow-up after recent EGD and colonoscopy.    07/18/2022 EGD was normal.  Repeat recommended 3 years for surveillance.  Given family history of Lynch syndrome.    07/18/2022 colonoscopy with one 3 mm polyp in the ascending colon, one 9 mm polyp in the transverse colon otherwise normal.  Repeat recommended in 1 year for surveillance.  Discussed IBgard 1-2 p.o. 3 times daily as needed for abdominal pain and cramping.  Pathology showed 1 tubular adenoma and 1 inflammatory polyp.  Recommended genetics referral to assess for Lynch and other disorders.    Today, patient presents to clinic and tells me that he has always had trouble with what sounds like an irritable stomach.  Noting that he has diarrhea most of the time and some abdominal pain which originates to the right of his umbilicus and spreads up his abdomen at different times.  He tells me if he drinks the "[place] drink with aloe pieces in it", this helps to harden his stool and otherwise he cannot really tell what helps or not.  He tried Dicyclomine 10 mg but this gave him blurred vision so he discontinued it.  Denies any blood in his stool.    Denies fever, chills, weight loss or symptoms that awaken him from sleep.  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 (Patient not taking: Reported on 07/18/2022)     HYDROcodone-acetaminophen (NORCO/VICODIN) 5-325 MG tablet Take 1 tablet by mouth every 8 (eight) hours as needed for moderate pain. (Patient not taking: Reported on 06/21/2022) 30 tablet 0   lidocaine (XYLOCAINE) 2 % solution Use as directed 15 mLs in the mouth or throat every 3 (three) hours as needed (mouth/throat pain - gargle and spit). (Patient not taking: Reported on 06/21/2022) 100 mL 0   No current facility-administered medications for this visit.    Allergies as of 08/30/2022 - Review Complete 07/18/2022  Allergen Reaction Noted   Amoxicillin Other (See Comments) 02/10/2016    Family History  Problem Relation Age of Onset   Hyperlipidemia Father    Hypertension Father    Stroke Father    Colon cancer Father 97       Lynch +   Colon polyps Father    Other Brother        Lynch Syndrome  Depression Maternal Aunt    Cancer Maternal Aunt        unk type   Colon cancer Paternal Aunt        Lynch +   Colon polyps Paternal Aunt    Colon cancer Paternal Aunt        Lynch +   Pancreatic cancer Paternal Aunt    Colon polyps Paternal Aunt    Stomach cancer Paternal Aunt    Alcohol abuse Paternal Uncle    Colon cancer Paternal Uncle        Lynch+   Diabetes Paternal Uncle    Diabetes Maternal Grandfather    Lung cancer Maternal Grandfather    Colon cancer Paternal Grandfather    Colon polyps Paternal Grandfather    Esophageal cancer Neg Hx    Rectal cancer Neg Hx    Crohn's disease Neg Hx     Social History   Socioeconomic History   Marital status: Single    Spouse name: Not on file   Number of children: 0    Years of education: Not on file   Highest education level: Not on file  Occupational History   Not on file  Tobacco Use   Smoking status: Never    Passive exposure: Current   Smokeless tobacco: Never  Vaping Use   Vaping Use: Former  Substance and Sexual Activity   Alcohol use: Yes    Comment: Socially   Drug use: Not Currently    Types: Marijuana    Comment: In 20's   Sexual activity: Not Currently  Other Topics Concern   Not on file  Social History Narrative   Not on file   Social Determinants of Health   Financial Resource Strain: Not on file  Food Insecurity: Not on file  Transportation Needs: Not on file  Physical Activity: Not on file  Stress: Not on file  Social Connections: Not on file  Intimate Partner Violence: Not on file    Review of Systems:    Constitutional: No weight loss, fever or chills Cardiovascular: No chest pain   Respiratory: No SOB  Gastrointestinal: See HPI and otherwise negative   Physical Exam:  Vital signs: BP (!) 140/68   Pulse 91   Ht _0  (1.702 m)   Wt 246 lb 2 oz (111.6 kg)   BMI 38.55 kg/m    Constitutional:   Pleasant Caucasian male appears to be in NAD, Well developed, Well nourished, alert and cooperative Respiratory: Respirations even and unlabored. Lungs clear to auscultation bilaterally.   No wheezes, crackles, or rhonchi.  Cardiovascular: Normal S1, S2. No MRG. Regular rate and rhythm. No peripheral edema, cyanosis or pallor.  Gastrointestinal:  Soft, nondistended, nontender. No rebound or guarding. Normal bowel sounds. No appreciable masses or hepatomegaly. Rectal:  Not performed.  Psychiatric: Oriented to person, place and time. Demonstrates good judgement and reason without abnormal affect or behaviors.  No recent labs.  Assessment: 1.  IBS-D: Recent EGD and colonoscopy with no other cause found for diarrhea and some abdominal cramping, tried Dicyclomine with side effect of blurred vision so discontinued 2.   Lynch syndrome: Recent colonoscopy and EGD in October.  Repeat colon recommended 10/24 and EGD 10/26  Plan: 1.  Patient placed in recall for repeat colonoscopy in October 2024 and repeat EGD in October 2026 2.  Discussed trying IBgard 1-2 tabs 3 times daily for IBS-D.  Provided the patient with samples. 3.  Patient will call and let us know how  he is doing after starting the above, if he would like some further assistance with his IBS he will call and let us know.  Ellouise Newer, PA-C [facility] 08/30/2022, 9:02 AM  Cc: Emeterio Reeve, DO

## 2022-08-30 NOTE — Patient Instructions (Signed)
_______________________________________________________  If you are age 42 or older, your body mass index should be between 23-30. Your Body mass index is 38.55 kg/m. If this is out of the aforementioned range listed, please consider follow up with your Primary Care Provider.  If you are age 71 or younger, your body mass index should be between 19-25. Your Body mass index is 38.55 kg/m. If this is out of the aformentioned range listed, please consider follow up with your Primary Care Provider.   ________________________________________________________  The [facility] providers would like to encourage you to use [facility] to communicate with providers for non-urgent requests or questions.  Due to long hold times on the telephone, sending your provider a message by [facility] may be a faster and more efficient way to get a response.  Please allow 48 business hours for a response.  Please remember that this is for non-urgent requests.  _______________________________________________________   We have given you samples of the following medication to take: IB Guard : Take 1-2 tabs twice daily   You will be due for your next colonoscopy in Oct 2024. You will receive a letter reminding you to schedule.

## 2022-09-04 IMAGING — MR MR HIP*R* W/CM
7 series · 40 of 40 positions shown · IV contrast (gadavist)
Comparison: None.

CLINICAL DATA: Right hip pain.

EXAM:
MRI OF THE RIGHT HIP WITH CONTRAST
TECHNIQUE: Multiplanar, multisequence MR imaging was performed following the
administration of intravenous contrast.
CONTRAST:  1mL GADAVIST GADOBUTROL 1 MMOL/ML IV SOLN

[Series 5: T2 fat-sat · coronal · 4.0mm · 1.41mm/px · 7 of 41 slices shown]
[im 1/41]
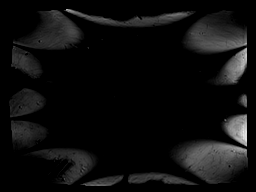
[im 7/41]
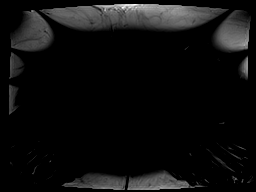
[im 14/41]
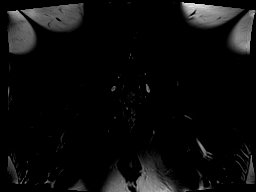
[im 21/41]
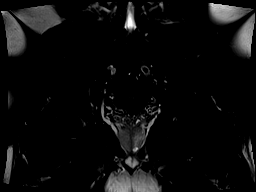
[im 27/41]
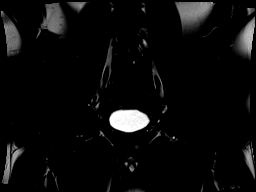
[im 34/41]
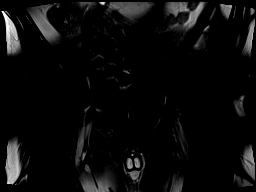
[im 41/41]
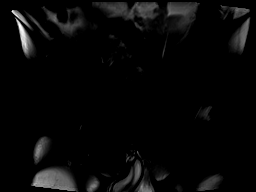

[Series 6: STIR · coronal · 4.0mm · 1.33mm/px · 7 of 41 slices shown]
[im 1/41]
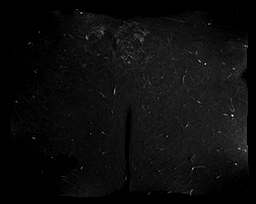
[im 7/41]
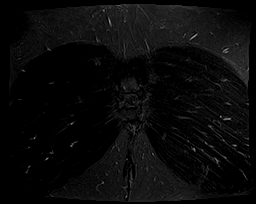
[im 14/41]
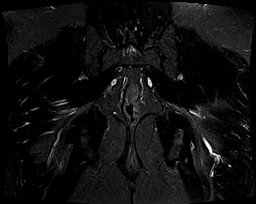
[im 21/41]
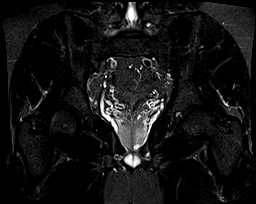
[im 27/41]
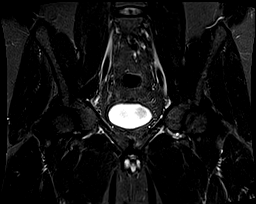
[im 34/41]
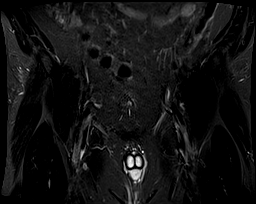
[im 41/41]
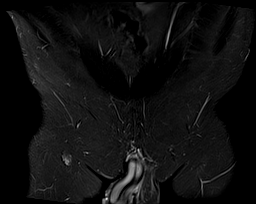

[Series 7: T1 · coronal · 4.0mm · 1.33mm/px · 8 of 41 slices shown]
[im 1/41]
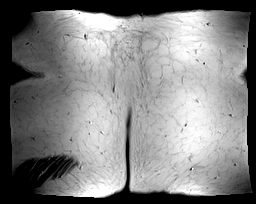
[im 6/41]
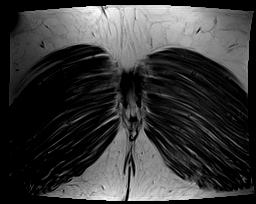
[im 12/41]
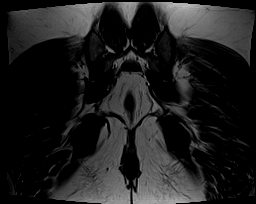
[im 18/41]
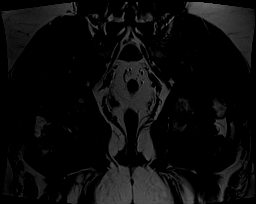
[im 23/41]
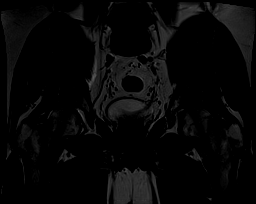
[im 29/41]
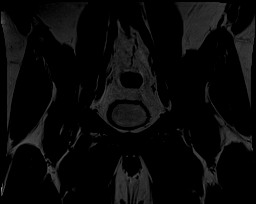
[im 35/41]
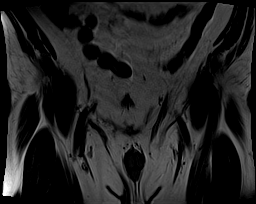
[im 41/41]
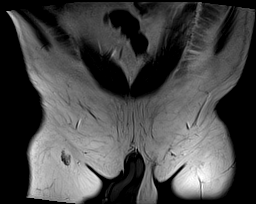

[Series 8: T1 fat-sat · axial · 4.0mm · 1.48mm/px · z∈[-80,+40]mm · 5 of 25 slices shown (1 of 4)]
[im 1/25]
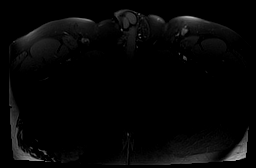
[im 7/25]
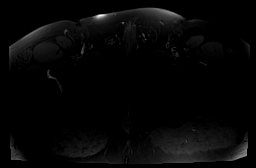
[im 13/25]
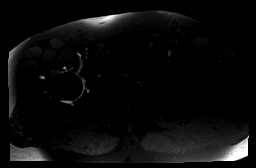
[im 19/25]
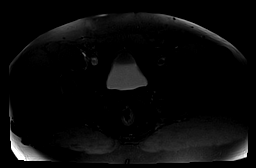
[im 25/25]
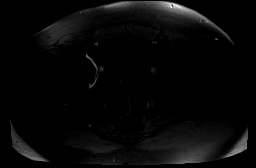

[Series 13: T1 fat-sat · axial · 4.0mm · 0.70mm/px · z∈[-111,-33]mm · 4 of 20 slices shown (2 of 4)]
[im 1/20]
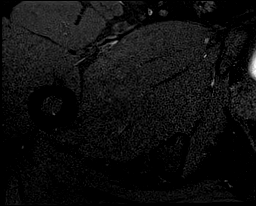
[im 7/20]
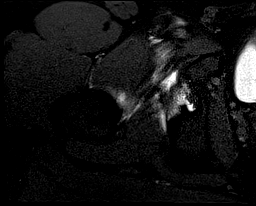
[im 13/20]
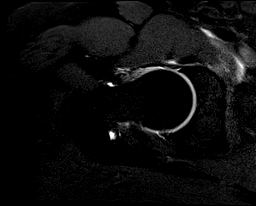
[im 20/20]
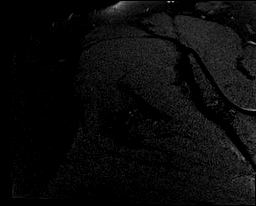

[Series 14: T1 fat-sat · coronal · 4.0mm · 0.70mm/px · 4 of 23 slices shown (3 of 4)]
[im 1/23]
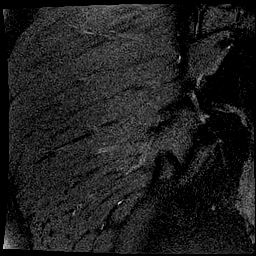
[im 8/23]
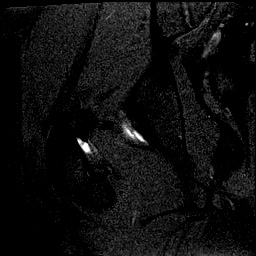
[im 15/23]
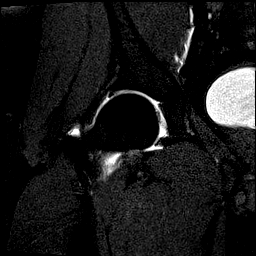
[im 23/23]
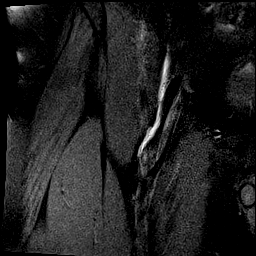

[Series 15: T1 fat-sat · sagittal · 4.0mm · 0.70mm/px · 5 of 26 slices shown (4 of 4)]
[im 1/26]
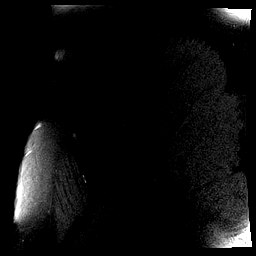
[im 7/26]
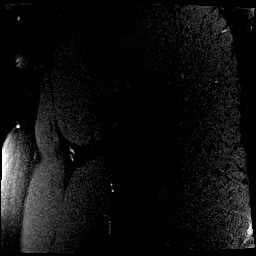
[im 13/26]
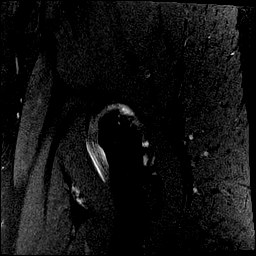
[im 19/26]
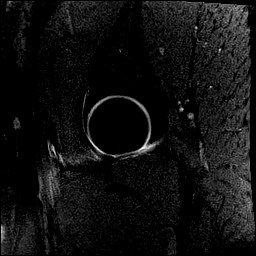
[im 26/26]
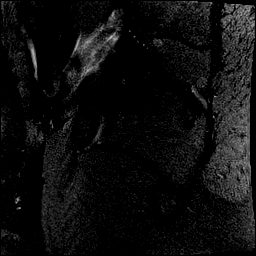

[40 of 40 positions shown; findings below may reference images not displayed]

FINDINGS: Bone

No hip fracture, dislocation or avascular necrosis. No aggressive
osseous lesion.

SI joints are normal. No SI joint widening or erosive changes.

Lower lumbar spine demonstrates no focal abnormality.

Alignment

Normal. No subluxation.

Dysplasia

None.

Joint effusion

Intraarticular contrast distending the right hip joint capsule. No
left hip joint effusion. No SI joint effusion.

Labrum

Right anterior labral tear. Multiloculated cystic structure along
the anterior inferior left labrum concerning for a paralabral cyst
or ganglion cyst.

Cartilage

Femoral cartilage: Normal.

Acetabular cartilage: Normal.

Capsule and ligaments

Normal.

Muscles and Tendons

Flexors: Normal.

Extensors: Normal.

Abductors: Normal.

Adductors: Normal.

Rotators: Normal.

Hamstrings: Normal.

Other Findings

None

Viscera

Normal. No abnormality seen in pelvis. No lymphadenopathy. No free
fluid in the pelvis.
IMPRESSION: 1. Right anterior labral tear.
2. Multiloculated cystic structure along the anterior inferior left
labrum concerning for a paralabral cyst or ganglion cyst. Similar
smaller multiloculated cystic structure along the anterior inferior
right labrum which may reflect a small paralabral cyst.
3. No hip fracture, dislocation or avascular necrosis.

## 2023-02-05 DIAGNOSIS — K920 Hematemesis: Secondary | ICD-10-CM | POA: Diagnosis not present

## 2023-02-12 DIAGNOSIS — K920 Hematemesis: Secondary | ICD-10-CM | POA: Diagnosis not present

## 2023-02-12 DIAGNOSIS — Z0000 Encounter for general adult medical examination without abnormal findings: Secondary | ICD-10-CM | POA: Diagnosis not present

## 2023-02-12 DIAGNOSIS — R197 Diarrhea, unspecified: Secondary | ICD-10-CM | POA: Diagnosis not present

## 2023-02-12 DIAGNOSIS — K589 Irritable bowel syndrome without diarrhea: Secondary | ICD-10-CM | POA: Diagnosis not present

## 2023-02-14 DIAGNOSIS — K920 Hematemesis: Secondary | ICD-10-CM | POA: Diagnosis not present

## 2023-04-25 ENCOUNTER — Encounter: Payer: Self-pay | Admitting: Gastroenterology

## 2023-04-25 ENCOUNTER — Ambulatory Visit (INDEPENDENT_AMBULATORY_CARE_PROVIDER_SITE_OTHER): Payer: BC Managed Care – PPO | Admitting: Gastroenterology

## 2023-04-25 VITALS — BP 124/80 | HR 85 | Ht 67.0 in | Wt 256.0 lb

## 2023-04-25 DIAGNOSIS — R112 Nausea with vomiting, unspecified: Secondary | ICD-10-CM

## 2023-04-25 DIAGNOSIS — K580 Irritable bowel syndrome with diarrhea: Secondary | ICD-10-CM

## 2023-04-25 DIAGNOSIS — K920 Hematemesis: Secondary | ICD-10-CM | POA: Diagnosis not present

## 2023-04-25 MED ORDER — OMEPRAZOLE 20 MG PO CPDR: 20.0000 mg | DELAYED_RELEASE_CAPSULE | Freq: Every day | ORAL | 1 refills | Status: AC

## 2023-04-25 NOTE — Progress Notes (Signed)
    Assessment     Nausea, vomiting, hematemesis - R/O MW tear, ulcer Lynch syndrome IBS-D   Recommendations    Discussed EGD to further evaluate however symptoms resolved in May. He is interested in EGD at his colonoscopy in October. If symptoms return EGD we can pursue at that time.  Omeprazole 20 mg qd for 2 months Resume IBgard 1-2 tid prn abdominal pain Low FODMAP diet Colonoscopy and EGD in October   HPI    This is a 42 year old male with nausea, vomiting then hematemesis in April. Seen at [facility]: H pylori stool Ag negative. Hgb=15.6. Gluc=160. He was treated with omeprazole 40 mg qd however the patient stopped it after 2 weeks when his symptoms resolved. They have not returned. He feels IBgard helps his IBS abdominal pain however he is not currently taking it.    Labs / Imaging       Latest Ref Rng & Units 08/02/2018    9:19 AM 06/18/2018   11:47 AM 06/29/2017    9:10 AM  Hepatic Function  Total Protein 6.1 - 8.1 g/dL 6.9  6.9  6.7   AST 10 - 40 U/L 15  23  18    ALT 9 - 46 U/L 25  29  23    Total Bilirubin 0.2 - 1.2 mg/dL 0.4  0.5  0.6        Latest Ref Rng & Units 06/18/2018   11:47 AM 06/29/2017    9:10 AM 08/03/2016    1:33 PM  CBC  WBC 3.8 - 10.8 Thousand/uL 9.5  10.0  9.5   Hemoglobin 13.2 - 17.1 g/dL 16.1  09.6  04.5   Hematocrit 38.5 - 50.0 % 46.4  44.8  46.9   Platelets 140 - 400 Thousand/uL 357  337  315     Current Medications, Allergies, Past Medical History, Past Surgical History, Family History and Social History were reviewed in [employer] record.   Physical Exam: General: Well developed, well nourished, no acute distress Head: Normocephalic and atraumatic Eyes: Sclerae anicteric, EOMI Ears: Normal auditory acuity Mouth: No deformities or lesions noted Lungs: Clear throughout to auscultation Heart: Regular rate and rhythm; No murmurs, rubs or bruits Abdomen: Soft, non tender and non distended. No masses,  hepatosplenomegaly or hernias noted. Normal Bowel sounds Rectal: Not done Musculoskeletal: Symmetrical with no gross deformities  Pulses:  Normal pulses noted Extremities: No edema or deformities noted Neurological: Alert oriented x 4, grossly nonfocal Psychological:  Alert and cooperative. Normal mood and affect   Donato Studley T. Russella Dar, MD 04/25/2023, 4:08 PM

## 2023-04-25 NOTE — Patient Instructions (Signed)
We have sent the following medications to your pharmacy for you to pick up at your convenience: omeprazole 20 mg daily x 2 months.   Take over the counter IBgard 1-2 capsules three times a day as needed.   _______________________________________________________  If your blood pressure at your visit was 140/90 or greater, please contact your primary care physician to follow up on this.  _______________________________________________________  If you are age 71 or older, your body mass index should be between 23-30. Your Body mass index is 40.1 kg/m. If this is out of the aforementioned range listed, please consider follow up with your Primary Care Provider.  If you are age 43 or younger, your body mass index should be between 19-25. Your Body mass index is 40.1 kg/m. If this is out of the aformentioned range listed, please consider follow up with your Primary Care Provider.   ________________________________________________________  The [facility] providers would like to encourage you to use [facility] to communicate with providers for non-urgent requests or questions.  Due to long hold times on the telephone, sending your provider a message by [facility] may be a faster and more efficient way to get a response.  Please allow 48 business hours for a response.  Please remember that this is for non-urgent requests.  _______________________________________________________  Thank you for choosing me and [facility].  Venita Lick. Pleas Koch., MD., Clementeen Graham

## 2023-05-14 ENCOUNTER — Telehealth: Payer: Self-pay | Admitting: Gastroenterology

## 2023-05-14 NOTE — Telephone Encounter (Signed)
The pt states that he took omeprazole on Friday and began to feel feverish, nauseous and very fatigued. He believes that this is an allergic reaction to the omeprazole.  He only took 1 dose last dose was on this past Friday but his symptoms continue. I have advised that he should call his PCP because this could be viral.  He will call PCP and will not take any more omeprazole.  FYI Dr Russella Dar

## 2023-05-14 NOTE — Telephone Encounter (Signed)
Patient called requesting a call back states he is having an allergic reaction to the Omeprazole.

## 2023-05-14 NOTE — Telephone Encounter (Signed)
Left message on machine to call back  

## 2023-05-15 NOTE — Telephone Encounter (Signed)
The pt has been advised of the recommendations. He will try Pepcid 20 mg BID and follow up with PCP

## 2023-05-15 NOTE — Telephone Encounter (Signed)
Left message on machine to call back  

## 2023-05-16 DIAGNOSIS — Z888 Allergy status to other drugs, medicaments and biological substances status: Secondary | ICD-10-CM | POA: Diagnosis not present

## 2023-05-16 DIAGNOSIS — R110 Nausea: Secondary | ICD-10-CM | POA: Diagnosis not present

## 2023-05-16 DIAGNOSIS — K831 Obstruction of bile duct: Secondary | ICD-10-CM | POA: Diagnosis not present

## 2023-05-16 DIAGNOSIS — R109 Unspecified abdominal pain: Secondary | ICD-10-CM | POA: Diagnosis not present

## 2023-05-16 DIAGNOSIS — R1084 Generalized abdominal pain: Secondary | ICD-10-CM | POA: Diagnosis not present

## 2023-05-16 DIAGNOSIS — R6883 Chills (without fever): Secondary | ICD-10-CM | POA: Diagnosis not present

## 2023-05-16 DIAGNOSIS — K8020 Calculus of gallbladder without cholecystitis without obstruction: Secondary | ICD-10-CM | POA: Diagnosis not present

## 2023-05-16 DIAGNOSIS — R161 Splenomegaly, not elsewhere classified: Secondary | ICD-10-CM | POA: Diagnosis not present

## 2023-05-16 DIAGNOSIS — N3289 Other specified disorders of bladder: Secondary | ICD-10-CM | POA: Diagnosis not present

## 2023-05-16 DIAGNOSIS — R1011 Right upper quadrant pain: Secondary | ICD-10-CM | POA: Diagnosis not present

## 2023-05-16 DIAGNOSIS — K760 Fatty (change of) liver, not elsewhere classified: Secondary | ICD-10-CM | POA: Diagnosis not present

## 2023-05-16 DIAGNOSIS — R519 Headache, unspecified: Secondary | ICD-10-CM | POA: Diagnosis not present

## 2023-05-16 DIAGNOSIS — R739 Hyperglycemia, unspecified: Secondary | ICD-10-CM | POA: Diagnosis not present

## 2023-05-16 DIAGNOSIS — M542 Cervicalgia: Secondary | ICD-10-CM | POA: Diagnosis not present

## 2023-05-16 DIAGNOSIS — R197 Diarrhea, unspecified: Secondary | ICD-10-CM | POA: Diagnosis not present

## 2023-05-17 DIAGNOSIS — R519 Headache, unspecified: Secondary | ICD-10-CM | POA: Diagnosis not present

## 2023-05-17 DIAGNOSIS — K8020 Calculus of gallbladder without cholecystitis without obstruction: Secondary | ICD-10-CM | POA: Diagnosis not present

## 2023-05-17 DIAGNOSIS — R161 Splenomegaly, not elsewhere classified: Secondary | ICD-10-CM | POA: Diagnosis not present

## 2023-05-17 DIAGNOSIS — K760 Fatty (change of) liver, not elsewhere classified: Secondary | ICD-10-CM | POA: Diagnosis not present

## 2023-05-17 DIAGNOSIS — M542 Cervicalgia: Secondary | ICD-10-CM | POA: Diagnosis not present

## 2023-05-17 DIAGNOSIS — N3289 Other specified disorders of bladder: Secondary | ICD-10-CM | POA: Diagnosis not present

## 2023-05-30 ENCOUNTER — Other Ambulatory Visit: Payer: Self-pay | Admitting: Gastroenterology

## 2023-05-30 DIAGNOSIS — K029 Dental caries, unspecified: Secondary | ICD-10-CM | POA: Diagnosis not present

## 2023-05-30 DIAGNOSIS — E119 Type 2 diabetes mellitus without complications: Secondary | ICD-10-CM | POA: Diagnosis not present

## 2023-05-30 DIAGNOSIS — B308 Other viral conjunctivitis: Secondary | ICD-10-CM | POA: Diagnosis not present

## 2023-05-30 DIAGNOSIS — B309 Viral conjunctivitis, unspecified: Secondary | ICD-10-CM | POA: Diagnosis not present

## 2023-05-30 DIAGNOSIS — H538 Other visual disturbances: Secondary | ICD-10-CM | POA: Diagnosis not present

## 2023-05-30 DIAGNOSIS — Z7984 Long term (current) use of oral hypoglycemic drugs: Secondary | ICD-10-CM | POA: Diagnosis not present

## 2023-06-05 DIAGNOSIS — B300 Keratoconjunctivitis due to adenovirus: Secondary | ICD-10-CM | POA: Diagnosis not present

## 2023-06-08 DIAGNOSIS — B300 Keratoconjunctivitis due to adenovirus: Secondary | ICD-10-CM | POA: Diagnosis not present

## 2023-07-18 ENCOUNTER — Encounter: Payer: Self-pay | Admitting: Gastroenterology

## 2023-08-17 ENCOUNTER — Encounter: Payer: Self-pay | Admitting: Gastroenterology
